# Patient Record
Sex: Female | Born: 1995
Health system: Southern US, Community
[De-identification: ages and names within clinical notes are randomized; demographics above are authoritative.]

## PROBLEM LIST (undated history)

## (undated) DIAGNOSIS — K589 Irritable bowel syndrome without diarrhea: Secondary | ICD-10-CM

## (undated) HISTORY — DX: Irritable bowel syndrome, unspecified: K58.9

## (undated) HISTORY — PX: COLONOSCOPY: SHX174

---
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
## 2015-04-05 ENCOUNTER — Ambulatory Visit (INDEPENDENT_AMBULATORY_CARE_PROVIDER_SITE_OTHER): Payer: BLUE CROSS/BLUE SHIELD | Admitting: Family Medicine

## 2015-04-05 ENCOUNTER — Encounter: Payer: Self-pay | Admitting: Family Medicine

## 2015-04-05 VITALS — BP 110/70 | HR 68 | Temp 98.0°F | Wt 137.8 lb

## 2015-04-05 DIAGNOSIS — K589 Irritable bowel syndrome without diarrhea: Secondary | ICD-10-CM

## 2015-04-05 DIAGNOSIS — J029 Acute pharyngitis, unspecified: Secondary | ICD-10-CM | POA: Diagnosis not present

## 2015-04-05 DIAGNOSIS — Z7689 Persons encountering health services in other specified circumstances: Secondary | ICD-10-CM

## 2015-04-05 DIAGNOSIS — Z23 Encounter for immunization: Secondary | ICD-10-CM | POA: Diagnosis not present

## 2015-04-05 DIAGNOSIS — Z7189 Other specified counseling: Secondary | ICD-10-CM

## 2015-04-05 DIAGNOSIS — R51 Headache: Secondary | ICD-10-CM

## 2015-04-05 DIAGNOSIS — R519 Headache, unspecified: Secondary | ICD-10-CM | POA: Insufficient documentation

## 2015-04-05 LAB — POCT RAPID STREP A (OFFICE): Rapid Strep A Screen: NEGATIVE

## 2015-04-05 NOTE — Patient Instructions (Signed)
You received your flu shot today.   Treat your symptoms by staying well hydrated, taking Tylenol or ibuprofen as needed for low-grade fever or body aches and gargling with warm salt water.  Schedule an appointment at your convenience to return for complete physical exam and fasting labs.  Pharyngitis Pharyngitis is redness, pain, and swelling (inflammation) of your pharynx.  CAUSES  Pharyngitis is usually caused by infection. Most of the time, these infections are from viruses (viral) and are part of a cold. However, sometimes pharyngitis is caused by bacteria (bacterial). Pharyngitis can also be caused by allergies. Viral pharyngitis may be spread from person to person by coughing, sneezing, and personal items or utensils (cups, forks, spoons, toothbrushes). Bacterial pharyngitis may be spread from person to person by more intimate contact, such as kissing.  SIGNS AND SYMPTOMS  Symptoms of pharyngitis include:   Sore throat.   Tiredness (fatigue).   Low-grade fever.   Headache.  Joint pain and muscle aches.  Skin rashes.  Swollen lymph nodes.  Plaque-like film on throat or tonsils (often seen with bacterial pharyngitis). DIAGNOSIS  Your health care provider will ask you questions about your illness and your symptoms. Your medical history, along with a physical exam, is often all that is needed to diagnose pharyngitis. Sometimes, a rapid strep test is done. Other lab tests may also be done, depending on the suspected cause.  TREATMENT  Viral pharyngitis will usually get better in 3-4 days without the use of medicine. Bacterial pharyngitis is treated with medicines that kill germs (antibiotics).  HOME CARE INSTRUCTIONS   Drink enough water and fluids to keep your urine clear or pale yellow.   Only take over-the-counter or prescription medicines as directed by your health care provider:   If you are prescribed antibiotics, make sure you finish them even if you start to feel  better.   Do not take aspirin.   Get lots of rest.   Gargle with 8 oz of salt water ( tsp of salt per 1 qt of water) as often as every 1-2 hours to soothe your throat.   Throat lozenges (if you are not at risk for choking) or sprays may be used to soothe your throat. SEEK MEDICAL CARE IF:   You have large, tender lumps in your neck.  You have a rash.  You cough up green, yellow-brown, or bloody spit. SEEK IMMEDIATE MEDICAL CARE IF:   Your neck becomes stiff.  You drool or are unable to swallow liquids.  You vomit or are unable to keep medicines or liquids down.  You have severe pain that does not go away with the use of recommended medicines.  You have trouble breathing (not caused by a stuffy nose). MAKE SURE YOU:   Understand these instructions.  Will watch your condition.  Will get help right away if you are not doing well or get worse.   This information is not intended to replace advice given to you by your health care provider. Make sure you discuss any questions you have with your health care provider.   Document Released: 05/15/2005 Document Revised: 03/05/2013 Document Reviewed: 01/20/2013 Elsevier Interactive Patient Education [employer].

## 2015-04-05 NOTE — Progress Notes (Signed)
Subjective:    Patient ID: Rebecca Schmidt, female    DOB: [birth year], 19 y.o.   MRN: [id number]  HPI Chief Complaint  Patient presents with  . new pt    sore throat since sunday.    She is new to the practice and here to establish primary care. She is also here for an acute complaint. Complains of sore throat for past 2 days and left ear pain and dry cough that started this morning. States her boyfriend has been sick also.  Denies fever, chills, drainage, nausea, vomiting, diarrhea. Has tried dayquil and nyquil. Does not smoke.  Also complains of frequent mild bilateral frontal headaches that have been ongoing, unsure if a pattern exists or what they are related to but states she does not typically stay hydrated.   She is a Museum/gallery exhibitions officer at [employer]. Has boyfriend and using Nuvaring. No issues with this. Has older brother.  States she had a pap smear this year and it was normal.   Will check immunizations and would like HPV vaccine if never had it.  Flu shot- would like to get this today.   Reviewed allergies, medications, past medical, surgical, family , and social history.  Review of Systems Pertinent positives and negatives in the history of present illness.    Objective:   Physical Exam  Constitutional: She is oriented to person, place, and time. She appears well-developed and well-nourished. No distress.  HENT:  Right Ear: Tympanic membrane and ear canal normal.  Left Ear: Tympanic membrane and ear canal normal.  Nose: Nose normal.  Mouth/Throat: Mucous membranes are normal. Posterior oropharyngeal edema and posterior oropharyngeal erythema present. No oropharyngeal exudate.  Eyes: Conjunctivae are normal.  Neck: Normal range of motion and full passive range of motion without pain. Neck supple.  Cardiovascular: Normal rate, regular rhythm and normal heart sounds.   Pulmonary/Chest: Effort normal and breath sounds normal. She has no wheezes. She has no rhonchi.  Lymphadenopathy:         Head (right side): No submandibular, no tonsillar and no occipital adenopathy present.       Head (left side): No submandibular, no tonsillar and no occipital adenopathy present.    She has no cervical adenopathy.       Right: No supraclavicular adenopathy present.       Left: No supraclavicular adenopathy present.  Tenderness noted to anterior cervical nodes, right greater than left.   Neurological: She is alert and oriented to person, place, and time.  Skin: Skin is warm and dry. No cyanosis. No pallor. Nails show no clubbing.   Rapid strep negative    Assessment & Plan:  Acute pharyngitis, unspecified etiology - Plan: POCT rapid strep A  Encounter to establish care  IBS (irritable bowel syndrome)  Need for influenza vaccination - Plan: Flu Vaccine QUAD 36+ mos IM  Frequent headaches  Discussed that her strep test was negative and that her symptoms are most likely related to a viral etiology.  Recommend symptomatic treatment including salt water gargles , staying well-hydrated, using Tylenol or ibuprofen as needed,  She may also use throat lozenges.  Received flu shot today. Her IBS seems to be well managed at present. She takes her Bentyl as needed. No changes recommended. Headaches appear to be related to stress/tension and possibly not staying well hydrated. She will try hydrating and keeping record of when she has the headaches and we will further discuss this at next appointment.  She will make an appointment  to return for fasting labs and complete physical exam.

## 2015-06-21 ENCOUNTER — Encounter: Payer: Self-pay | Admitting: Family Medicine

## 2015-06-21 ENCOUNTER — Ambulatory Visit (INDEPENDENT_AMBULATORY_CARE_PROVIDER_SITE_OTHER): Payer: BLUE CROSS/BLUE SHIELD | Admitting: Family Medicine

## 2015-06-21 VITALS — BP 116/68 | HR 64 | Temp 98.1°F | Wt 141.2 lb

## 2015-06-21 DIAGNOSIS — J069 Acute upper respiratory infection, unspecified: Secondary | ICD-10-CM | POA: Diagnosis not present

## 2015-06-21 NOTE — Patient Instructions (Signed)
Try taking delsym, mucinex cough or robitussin cough. Drink extra fluids. Let me know if you get worse.

## 2015-06-21 NOTE — Progress Notes (Signed)
Subjective:  Rebecca Schmidt is a 20 y.o. female who presents for complaints of URI symptoms. 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 She states she gets better for a few weeks and then gets another cold.   Symptoms this time include a 5 day history of runny nose, congestion, dry cough, sore throat . States she is 50% better. Denies fever, chills, body aches, ear pain, nausea, vomiting or diarrhea.   Treatment to date: none. sick contacts boyfriend.  No other aggravating or relieving factors.  No other c/o.  ROS as in subjective.    Objective: Filed Vitals:   06/21/15 1619  BP: 116/68  Pulse: 64  Temp: 98.1 F (36.7 C)    General appearance: Alert, WD/WN, no distress, mildly ill appearing                             Skin: warm, no rash                           Head: no sinus tenderness                            Eyes: conjunctiva normal, corneas clear, PERRLA                            Ears: pearly TMs, external ear canals normal                          Nose: septum midline, turbinates swollen, with erythema and clear discharge             Mouth/throat: MMM, tongue normal, mild pharyngeal erythema                           Neck: supple, no adenopathy, no thyromegaly, nontender                          Heart: RRR, normal S1, S2, no murmurs                         Lungs: CTA bilaterally, no wheezes, rales, or rhonchi     Assessment: Acute upper respiratory infection  Plan: Discussed diagnosis and treatment of URI.  Suggested symptomatic OTC remedies such as delsym, and staying well hydrated.  Nasal saline spray for congestion.  Tylenol or Ibuprofen OTC for fever and malaise.  Call/return in 2-3 days if symptoms aren't resolving.

## 2015-06-28 ENCOUNTER — Encounter: Payer: Self-pay | Admitting: Family Medicine

## 2015-06-28 ENCOUNTER — Ambulatory Visit (INDEPENDENT_AMBULATORY_CARE_PROVIDER_SITE_OTHER): Payer: BLUE CROSS/BLUE SHIELD | Admitting: Family Medicine

## 2015-06-28 VITALS — BP 112/64 | HR 64 | Ht 68.5 in | Wt 139.6 lb

## 2015-06-28 DIAGNOSIS — Z808 Family history of malignant neoplasm of other organs or systems: Secondary | ICD-10-CM | POA: Diagnosis not present

## 2015-06-28 DIAGNOSIS — Z0000 Encounter for general adult medical examination without abnormal findings: Secondary | ICD-10-CM | POA: Diagnosis not present

## 2015-06-28 DIAGNOSIS — D239 Other benign neoplasm of skin, unspecified: Secondary | ICD-10-CM | POA: Diagnosis not present

## 2015-06-28 DIAGNOSIS — Z23 Encounter for immunization: Secondary | ICD-10-CM | POA: Diagnosis not present

## 2015-06-28 DIAGNOSIS — R5383 Other fatigue: Secondary | ICD-10-CM

## 2015-06-28 DIAGNOSIS — D229 Melanocytic nevi, unspecified: Secondary | ICD-10-CM

## 2015-06-28 DIAGNOSIS — Z8342 Family history of familial hypercholesterolemia: Secondary | ICD-10-CM | POA: Insufficient documentation

## 2015-06-28 DIAGNOSIS — K589 Irritable bowel syndrome without diarrhea: Secondary | ICD-10-CM

## 2015-06-28 LAB — LIPID PANEL
CHOLESTEROL: 141 mg/dL (ref 125–170)
HDL: 36 mg/dL — ABNORMAL LOW (ref >=46)
LDL CALC: 90 mg/dL (ref <110)
TRIGLYCERIDES: 73 mg/dL (ref <150)
Total CHOL/HDL Ratio: 3.9 Ratio (ref <=5.0)
VLDL: 15 mg/dL (ref <30)

## 2015-06-28 LAB — POCT URINALYSIS DIPSTICK
BILIRUBIN UA: NEGATIVE
Blood, UA: NEGATIVE
GLUCOSE UA: NEGATIVE
Ketones, UA: NEGATIVE
LEUKOCYTES UA: NEGATIVE
NITRITE UA: NEGATIVE
PH UA: 6
Protein, UA: NEGATIVE
Spec Grav, UA: >=1.03
UROBILINOGEN UA: NEGATIVE

## 2015-06-28 LAB — COMPREHENSIVE METABOLIC PANEL
ALBUMIN: 4.3 g/dL (ref 3.6–5.1)
ALK PHOS: 48 U/L (ref 33–115)
ALT: 14 U/L (ref 6–29)
AST: 18 U/L (ref 10–30)
BILIRUBIN TOTAL: 0.7 mg/dL (ref 0.2–1.2)
BUN: 7 mg/dL (ref 7–25)
CALCIUM: 9.6 mg/dL (ref 8.6–10.2)
CO2: 24 mmol/L (ref 20–31)
CREATININE: 0.6 mg/dL (ref 0.50–1.10)
Chloride: 104 mmol/L (ref 98–110)
Glucose, Bld: 72 mg/dL (ref 65–99)
Potassium: 4.2 mmol/L (ref 3.5–5.3)
Sodium: 137 mmol/L (ref 135–146)
Total Protein: 7.3 g/dL (ref 6.1–8.1)

## 2015-06-28 LAB — CBC WITH DIFFERENTIAL/PLATELET
BASOS ABS: 0 10*3/uL (ref 0.0–0.1)
BASOS PCT: 0 % (ref 0–1)
EOS ABS: 0.1 10*3/uL (ref 0.0–0.7)
Eosinophils Relative: 1 % (ref 0–5)
HEMATOCRIT: 40.6 % (ref 36.0–46.0)
HEMOGLOBIN: 13.5 g/dL (ref 12.0–15.0)
Lymphocytes Relative: 31 % (ref 12–46)
Lymphs Abs: 3 10*3/uL (ref 0.7–4.0)
MCH: 28.9 pg (ref 26.0–34.0)
MCHC: 33.3 g/dL (ref 30.0–36.0)
MCV: 86.9 fL (ref 78.0–100.0)
MONOS PCT: 8 % (ref 3–12)
MPV: 10.7 fL (ref 8.6–12.4)
Monocytes Absolute: 0.8 10*3/uL (ref 0.1–1.0)
NEUTROS ABS: 5.8 10*3/uL (ref 1.7–7.7)
NEUTROS PCT: 60 % (ref 43–77)
Platelets: 275 10*3/uL (ref 150–400)
RBC: 4.67 MIL/uL (ref 3.87–5.11)
RDW: 13.4 % (ref 11.5–15.5)
WBC: 9.6 10*3/uL (ref 4.0–10.5)

## 2015-06-28 LAB — TSH: TSH: 0.99 u[IU]/mL (ref 0.350–4.500)

## 2015-06-28 NOTE — Patient Instructions (Addendum)
Return in 1-2 months for 2nd Gardasil vaccine and then in 6 months for 3rd and final injection.  Human Papillomavirus Quadrivalent Vaccine suspension for injection What is this medicine? HUMAN PAPILLOMAVIRUS VACCINE (HYOO muhn pap uh LOH muh vahy ruhs vak SEEN) is a vaccine. It is used to prevent infections of four types of the human papillomavirus. In women, the vaccine may lower your risk of getting cervical, vaginal, vulvar, or anal cancer and genital warts. In men, the vaccine may lower your risk of getting genital warts and anal cancer. You cannot get these diseases from the vaccine. This vaccine does not treat these diseases. This medicine may be used for other purposes; ask your health care provider or pharmacist if you have questions. What should I tell my health care provider before I take this medicine? They need to know if you have any of these conditions: -fever or infection -hemophilia -HIV infection or AIDS -immune system problems -low platelet count -an unusual reaction to Human Papillomavirus Vaccine, yeast, other medicines, foods, dyes, or preservatives -pregnant or trying to get pregnant -breast-feeding How should I use this medicine? This vaccine is for injection in a muscle on your upper arm or thigh. It is given by a health care professional. Dennis Bast will be observed for 15 minutes after each dose. Sometimes, fainting happens after the vaccine is given. You may be asked to sit or lie down during the 15 minutes. Three doses are given. The second dose is given 2 months after the first dose. The last dose is given 4 months after the second dose. A copy of a Vaccine Information Statement will be given before each vaccination. Read this sheet carefully each time. The sheet may change frequently. Talk to your pediatrician regarding the use of this medicine in children. While this drug may be prescribed for children as young as 33 years of age for selected conditions, precautions do  apply. Overdosage: If you think you have taken too much of this medicine contact a poison control center or emergency room at once. NOTE: This medicine is only for you. Do not share this medicine with others. What if I miss a dose? All 3 doses of the vaccine should be given within 6 months. Remember to keep appointments for follow-up doses. Your health care provider will tell you when to return for the next vaccine. Ask your health care professional for advice if you are unable to keep an appointment or miss a scheduled dose. What may interact with this medicine? -other vaccines This list may not describe all possible interactions. Give your health care provider a list of all the medicines, herbs, non-prescription drugs, or dietary supplements you use. Also tell them if you smoke, drink alcohol, or use illegal drugs. Some items may interact with your medicine. What should I watch for while using this medicine? This vaccine may not fully protect everyone. Continue to have regular pelvic exams and cervical or anal cancer screenings as directed by your doctor. The Human Papillomavirus is a sexually transmitted disease. It can be passed by any kind of sexual activity that involves genital contact. The vaccine works best when given before you have any contact with the virus. Many people who have the virus do not have any signs or symptoms. Tell your doctor or health care professional if you have any reaction or unusual symptom after getting the vaccine. What side effects may I notice from receiving this medicine? Side effects that you should report to your doctor or health  care professional as soon as possible: -allergic reactions like skin rash, itching or hives, swelling of the face, lips, or tongue -breathing problems -feeling faint or lightheaded, falls Side effects that usually do not require medical attention (report to your doctor or health care professional if they continue or are  bothersome): -cough -dizziness -fever -headache -nausea -redness, warmth, swelling, pain, or itching at site where injected This list may not describe all possible side effects. Call your doctor for medical advice about side effects. You may report side effects to FDA at 1-800-FDA-1088. Where should I keep my medicine? This drug is given in a hospital or clinic and will not be stored at home. NOTE: This sheet is a summary. It may not cover all possible information. If you have questions about this medicine, talk to your doctor, pharmacist, or health care provider.    2016, Elsevier/Gold Standard. (2013-07-07 13:14:33)  [facility] [place] [place], [place] [facility] for Adults - Female      MAINTAIN REGULAR HEALTH EXAMS:  A routine yearly physical is a good way to check in with your primary care provider about your health and preventive screening. It is also an opportunity to share updates about your health and any concerns you have, and receive a thorough all-over exam.   Most health insurance companies pay for at least some preventative services.  Check with your health plan for specific coverages.  WHAT PREVENTATIVE SERVICES DO WOMEN NEED?  Adult women should have their weight and blood pressure checked regularly.   Women age 57 and older should have their cholesterol levels checked regularly.  Women should be screened for cervical cancer with a Pap smear and pelvic exam beginning at either age 45, or 3 years after they become sexually activity.    Breast cancer screening generally begins at age 66 with a mammogram and breast exam by your primary care provider.    Beginning at age 24 and continuing to age 29, women should be screened for colorectal cancer.  Certain people may need continued testing until age 39.  Updating vaccinations is part of preventative care.  Vaccinations help protect against diseases such as the  flu.  Osteoporosis is a disease in which the bones lose minerals and strength as we age. Women ages 50 and over should discuss this with their caregivers, as should women after menopause who have other risk factors.  Lab tests are generally done as part of preventative care to screen for anemia and blood disorders, to screen for problems with the kidneys and liver, to screen for bladder problems, to check blood sugar, and to check your cholesterol level.  Preventative services generally include counseling about diet, exercise, avoiding tobacco, drugs, excessive alcohol consumption, and sexually transmitted infections.    GENERAL RECOMMENDATIONS FOR GOOD HEALTH:  Healthy diet:  Eat a variety of foods, including fruit, vegetables, animal or vegetable protein, such as meat, fish, chicken, and eggs, or beans, lentils, tofu, and grains, such as rice.  Drink plenty of water daily.  Decrease saturated fat in the diet, avoid lots of red meat, processed foods, sweets, fast foods, and fried foods.  Exercise:  Aerobic exercise helps maintain good heart health. At least 30-40 minutes of moderate-intensity exercise is recommended. For example, a brisk walk that increases your heart rate and breathing. This should be done on most days of the week.   Find a type of exercise or a variety of exercises that you enjoy so that  it becomes a part of your daily life.  Examples are running, walking, swimming, water aerobics, and biking.  For motivation and support, explore group exercise such as aerobic class, spin class, Zumba, Yoga,or  martial arts, etc.    Set exercise goals for yourself, such as a certain weight goal, walk or run in a race such as a 5k walk/run.  Speak to your primary care provider about exercise goals.  Disease prevention:  If you smoke or chew tobacco, find out from your caregiver how to quit. It can literally save your life, no matter how long you have been a tobacco user. If you do not  use tobacco, never begin.   Maintain a healthy diet and normal weight. Increased weight leads to problems with blood pressure and diabetes.   The Body Mass Index or BMI is a way of measuring how much of your body is fat. Having a BMI above 27 increases the risk of heart disease, diabetes, hypertension, stroke and other problems related to obesity. Your caregiver can help determine your BMI and based on it develop an exercise and dietary program to help you achieve or maintain this important measurement at a healthful level.  High blood pressure causes heart and blood vessel problems.  Persistent high blood pressure should be treated with medicine if weight loss and exercise do not work.   Fat and cholesterol leaves deposits in your arteries that can block them. This causes heart disease and vessel disease elsewhere in your body.  If your cholesterol is found to be high, or if you have heart disease or certain other medical conditions, then you may need to have your cholesterol monitored frequently and be treated with medication.   Ask if you should have a cardiac stress test if your history suggests this. A stress test is a test done on a treadmill that looks for heart disease. This test can find disease prior to there being a problem.  Menopause can be associated with physical symptoms and risks. Hormone replacement therapy is available to decrease these. You should talk to your caregiver about whether starting or continuing to take hormones is right for you.   Osteoporosis is a disease in which the bones lose minerals and strength as we age. This can result in serious bone fractures. Risk of osteoporosis can be identified using a bone density scan. Women ages 65 and over should discuss this with their caregivers, as should women after menopause who have other risk factors. Ask your caregiver whether you should be taking a calcium supplement and Vitamin D, to reduce the rate of osteoporosis.   Avoid  drinking alcohol in excess (more than two drinks per day).  Avoid use of street drugs. Do not share needles with anyone. Ask for professional help if you need assistance or instructions on stopping the use of alcohol, cigarettes, and/or drugs.  Brush your teeth twice a day with fluoride toothpaste, and floss once a day. Good oral hygiene prevents tooth decay and gum disease. The problems can be painful, unattractive, and can cause other health problems. Visit your dentist for a routine oral and dental check up and preventive care every 6-12 months.   Look at your skin regularly.  Use a mirror to look at your back. Notify your caregivers of changes in moles, especially if there are changes in shapes, colors, a size larger than a pencil eraser, an irregular border, or development of new moles.  Safety:  Use seatbelts 100% of the time,  whether driving or as a passenger.  Use safety devices such as hearing protection if you work in environments with loud noise or significant background noise.  Use safety glasses when doing any work that could send debris in to the eyes.  Use a helmet if you ride a bike or motorcycle.  Use appropriate safety gear for contact sports.  Talk to your caregiver about gun safety.  Use sunscreen with a SPF (or skin protection factor) of 15 or greater.  Lighter skinned people are at a greater risk of skin cancer. Don't forget to also wear sunglasses in order to protect your eyes from too much damaging sunlight. Damaging sunlight can accelerate cataract formation.   Practice safe sex. Use condoms. Condoms are used for birth control and to help reduce the spread of sexually transmitted infections (or STIs).  Some of the STIs are gonorrhea (the clap), chlamydia, syphilis, trichomonas, herpes, HPV (human papilloma virus) and HIV (human immunodeficiency virus) which causes AIDS. The herpes, HIV and HPV are viral illnesses that have no cure. These can result in disability, cancer and  death.   Keep carbon monoxide and smoke detectors in your home functioning at all times. Change the batteries every 6 months or use a model that plugs into the wall.   Vaccinations:  Stay up to date with your tetanus shots and other required immunizations. You should have a booster for tetanus every 10 years. Be sure to get your flu shot every year, since 5%-20% of the [place] population comes down with the flu. The flu vaccine changes each year, so being vaccinated once is not enough. Get your shot in the fall, before the flu season peaks.   Other vaccines to consider:  Human Papilloma Virus or HPV causes cancer of the cervix, and other infections that can be transmitted from person to person. There is a vaccine for HPV, and females should get immunized between the ages of 39 and 75. It requires a series of 3 shots.   Pneumococcal vaccine to protect against certain types of pneumonia.  This is normally recommended for adults age 31 or older.  However, adults younger than 21 years old with certain underlying conditions such as diabetes, heart or lung disease should also receive the vaccine.  Shingles vaccine to protect against Varicella Zoster if you are older than age 51, or younger than 21 years old with certain underlying illness.  Hepatitis A vaccine to protect against a form of infection of the liver by a virus acquired from food.  Hepatitis B vaccine to protect against a form of infection of the liver by a virus acquired from blood or body fluids, particularly if you work in health care.  If you plan to travel internationally, check with your local health department for specific vaccination recommendations.  Cancer Screening:  Breast cancer screening is essential to preventive care for women. All women age 110 and older should perform a breast self-exam every month. At age 54 and older, women should have their caregiver complete a breast exam each year. Women at ages 43 and older should have  a mammogram (x-ray film) of the breasts. Your caregiver can discuss how often you need mammograms.    Cervical cancer screening includes taking a Pap smear (sample of cells examined under a microscope) from the cervix (end of the uterus). It also includes testing for HPV (Human Papilloma Virus, which can cause cervical cancer). Screening and a pelvic exam should begin at age 63, or 3 years  after a woman becomes sexually active. Screening should occur every year, with a Pap smear but no HPV testing, up to age 38. After age 69, you should have a Pap smear every 3 years with HPV testing, if no HPV was found previously.   Most routine colon cancer screening begins at the age of 74. On a yearly basis, doctors may provide special easy to use take-home tests to check for hidden blood in the stool. Sigmoidoscopy or colonoscopy can detect the earliest forms of colon cancer and is life saving. These tests use a small camera at the end of a tube to directly examine the colon. Speak to your caregiver about this at age 1, when routine screening begins (and is repeated every 5 years unless early forms of pre-cancerous polyps or small growths are found).

## 2015-06-28 NOTE — Progress Notes (Signed)
Subjective:    Patient ID: Rebecca Schmidt, female    DOB: [birth year], 20 y.o.   MRN: [id number]  HPI Chief Complaint  Patient presents with  . fasting cpe    fasting cpe.    She is here for complete physical exam. Complaints of mild fatigue. 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 She also states her roommate keeps her up late.   Also reports 2 moles to her back, requesting dermatologist referral. States her mother had malignant melanoma and had to have surgery. She states she avoids sun and wears sunscreen now but as child she recalls having sunburns.  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 She reports taking Bentyl as needed. She states she is managing symptoms fine for now.   Other providers: Gynecologist is at [facility], Dr. Toy Cookey in [facility] diagnosed with IBS 1-2 years ago.  Last physical exam and blood work in 2015 per patient.   Pap smear- normal last year, in past 6 months at [facility].  Would like HPV vaccine, has not had this.   LMP: 06/02/2015 Contraception: uses Nuvaring In a monogamous relationship with boyfriend  Denies smoking, alcohol use 1-2 times per month, denies drug use. Tried marijuana once but did not like it.  She tries to exercise 2-3 times per week. Lifts weights and cardiovascular.  Patient states she is a hypochondriac. States when she complains her family just ignores her now.    Freshman at [employer]. She just moved into her dorm and does not care for her roommate, she states she is inconsiderate.  Health maintenance: dentist- is due for follow up for cavity.   Past Medical History  Diagnosis Date  . IBS (irritable bowel syndrome)      Review of Systems Review of Systems Constitutional: -fever, -chills, -sweats, -unexpected weight change,+ mild fatigue ENT: -runny nose, -ear pain, -sore throat Cardiology:  -chest pain,  -palpitations, -edema Respiratory: -cough, -shortness of breath, -wheezing Gastroenterology: -abdominal pain, -nausea, -vomiting, +diarrhea, +constipation  Hematology: -bleeding or bruising problems Musculoskeletal: +arthralgias- bilateral knee pain since childhood, -myalgias, -joint swelling, -back pain Ophthalmology: -vision changes Urology: -dysuria, -difficulty urinating, -hematuria, -urinary frequency, -urgency Neurology: -headache, -weakness, -tingling, -numbness       Objective:   Physical Exam BP 112/64 mmHg  Pulse 64  Ht 5' 8.5" (1.74 m)  Wt 139 lb 9.6 oz (63.322 kg)  BMI 20.91 kg/m2  LMP 06/02/2015  General Appearance:    Alert, cooperative, no distress, appears stated age  Head:    Normocephalic, without obvious abnormality, atraumatic  Eyes:    PERRL, conjunctiva/corneas clear, EOM's intact, fundi    benign  Ears:    Normal TM's and external ear canals  Nose:   Nares normal, mucosa normal, no drainage or sinus   tenderness  Throat:   Lips, mucosa, and tongue normal; teeth and gums normal  Neck:   Supple, no lymphadenopathy;  thyroid:  no   enlargement/tenderness/nodules; no carotid   bruit or JVD  Back:    Spine nontender, no curvature, ROM normal, no CVA     tenderness  Lungs:     Clear to auscultation bilaterally without wheezes, rales or     ronchi; respirations unlabored  Chest Wall:    No tenderness or deformity   Heart:    Regular rate and rhythm, S1 and S2 normal, no murmur, rub   or gallop  Breast Exam:    Refused, had this at Gynecologist 6 months ago  Abdomen:     Soft, non-tender, nondistended, normoactive bowel sounds,    no masses, no hepatosplenomegaly  Genitalia:    Not done- had this at [facility] 6 months ago  Rectal:    Not performed due to age<40 and no related complaints  Extremities:   No clubbing, cyanosis or edema  Pulses:   2+ and symmetric all extremities  Skin:   Skin color, texture, turgor normal, no rashes, raised 1 cm asymmetrical  and non uniform color nevi to left upper back  Lymph nodes:   Cervical, supraclavicular, and axillary nodes normal  Neurologic:   CNII-XII intact, normal strength, sensation and gait; reflexes 2+ and symmetric throughout          Psych:   Normal mood, affect, hygiene and grooming.     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 Suspect that her fatigue is related to stress of college and lack of sleep but will check for underlying physiologic etiologies.  She cannot recall having her cholesterol checked in past. Her father has history of elevated cholesterol. She is fasting today in order to check lipids. Referral made to atypical mole on back. Discussed that she is fair skinned and recommend that she continue wearing sunscreen and avoid sun.    1st dose of Gardasil given today. She will return in 1-2 months for 2nd injection and in 6 months for 3rd and final.  She is up to date on flu and other immunizations.  Discussed safety and health promotion. She will schedule with dentist for filling as discussed.  Follow up pending lab results.

## 2015-08-11 ENCOUNTER — Other Ambulatory Visit (INDEPENDENT_AMBULATORY_CARE_PROVIDER_SITE_OTHER): Payer: BLUE CROSS/BLUE SHIELD

## 2015-08-11 DIAGNOSIS — Z23 Encounter for immunization: Secondary | ICD-10-CM | POA: Diagnosis not present

## 2015-12-24 ENCOUNTER — Encounter: Payer: Self-pay | Admitting: Family Medicine

## 2016-01-12 ENCOUNTER — Ambulatory Visit (INDEPENDENT_AMBULATORY_CARE_PROVIDER_SITE_OTHER): Payer: BLUE CROSS/BLUE SHIELD | Admitting: Family Medicine

## 2016-01-12 ENCOUNTER — Encounter: Payer: Self-pay | Admitting: Family Medicine

## 2016-01-12 VITALS — BP 116/70 | HR 77 | Temp 98.6°F | Wt 141.0 lb

## 2016-01-12 DIAGNOSIS — R110 Nausea: Secondary | ICD-10-CM

## 2016-01-12 DIAGNOSIS — Z8719 Personal history of other diseases of the digestive system: Secondary | ICD-10-CM

## 2016-01-12 LAB — CBC WITH DIFFERENTIAL/PLATELET
BASOS ABS: 0 {cells}/uL (ref 0–200)
Basophils Relative: 0 %
EOS PCT: 1 %
Eosinophils Absolute: 59 cells/uL (ref 15–500)
HEMATOCRIT: 40.4 % (ref 35.0–45.0)
HEMOGLOBIN: 13.7 g/dL (ref 11.7–15.5)
LYMPHS PCT: 35 %
Lymphs Abs: 2065 cells/uL (ref 850–3900)
MCH: 29.7 pg (ref 27.0–33.0)
MCHC: 33.9 g/dL (ref 32.0–36.0)
MCV: 87.6 fL (ref 80.0–100.0)
MONO ABS: 531 {cells}/uL (ref 200–950)
MPV: 11 fL (ref 7.5–12.5)
Monocytes Relative: 9 %
NEUTROS PCT: 55 %
Neutro Abs: 3245 cells/uL (ref 1500–7800)
Platelets: 239 10*3/uL (ref 140–400)
RBC: 4.61 MIL/uL (ref 3.80–5.10)
RDW: 13.1 % (ref 11.0–15.0)
WBC: 5.9 10*3/uL (ref 4.0–10.5)

## 2016-01-12 LAB — POCT URINE PREGNANCY: Preg Test, Ur: NEGATIVE

## 2016-01-12 NOTE — Progress Notes (Signed)
   Subjective:    Patient ID: Rebecca Schmidt, female    DOB: 1996-01-08, 20 y.o.   MRN: [id number]  HPI Chief Complaint  Patient presents with  . Other    nauseous- off and on for a week   She is here with complaints of intermittent nausea for about a week. States the nausea occurs when she is hungry and sometimes when she is riding in a car. States sometimes nausea occurs with moving around a lot of getting hot. Denies dizziness, weakness or fatigue.  History of IBS, has had some watery diarrhea. Started back to school  Denies fever, chills, ear pain, nasal congestion, drainage, sore throat, cough, vomiting, or abdominal pain. Reports history of IBS that has not bothered her much in recent past. She has had some loose/watery stools intermittently.  Denies acid reflux.  She has noted increased stress, school just started back. She is at [employer]. Has a roommate.   States she skips breakfast often and eats a lot of carbohydrates. Sometimes feels shaky. Family history of diabetes.    Birth control is Nuvaring. No sexual activity in 2-3 months. Periods are regular.   Reviewed allergies, medications, past medical, family, and social history.    Review of Systems  Pertinent positives and negatives in the history of present illness.     Objective:   Physical Exam BP 116/70   Pulse 77   Temp 98.6 F (37 C) (Oral)   Wt 141 lb (64 kg)   BMI 21.13 kg/m  Alert and in no distress. Tympanic membranes and canals are normal. Pharyngeal area is normal. Neck is supple without adenopathy or thyromegaly. Cardiac exam shows a regular sinus rhythm without murmurs or gallops. Lungs are clear to auscultation. Abdomen soft, nontender, no rebound, guarding, normal bowel sounds, no hepatosplenomegaly.    Urine pregnancy negative    Assessment & Plan:  Nausea without vomiting - Plan: CBC with Differential/Platelet, Comprehensive metabolic panel, POCT urine pregnancy  History of IBS  Discussed  multiple etiologies for nausea. Plan to rule out pregnancy. She does not want to take medication therefore I recommend that she try ginger. I also recommend eating small frequent meals that include protein and avoid spicy, fried foods and foods that upset her stomach for at least the next 48 hours.  Discussed that even though she does not have symptoms of acid reflux that our next step would be trying antacid. She will call if she decides she would like to try Zofran but for now plan to try to determine underlying etiology for nausea. Will f/u pending labs.

## 2016-01-12 NOTE — Patient Instructions (Signed)
Stay well hydrated and try to eat small frequent meals that include protein and not all carbs.  Try taking ginger in some form, gum, ginger ale, etc.  If you are not improving then let me know in the next few days or if your symptoms get worse.  We will call with labs.    Nausea, Adult Nausea is the feeling that you have an upset stomach or have to vomit. Nausea by itself is not likely a serious concern, but it may be an early sign of more serious medical problems. As nausea gets worse, it can lead to vomiting. If vomiting develops, there is the risk of dehydration.  CAUSES   Viral infections.  Food poisoning.  Medicines.  Pregnancy.  Motion sickness.  Migraine headaches.  Emotional distress.  Severe pain from any source.  Alcohol intoxication. HOME CARE INSTRUCTIONS  Get plenty of rest.  Ask your caregiver about specific rehydration instructions.  Eat small amounts of food and sip liquids more often.  Take all medicines as told by your caregiver. SEEK MEDICAL CARE IF:  You have not improved after 2 days, or you get worse.  You have a headache. SEEK IMMEDIATE MEDICAL CARE IF:   You have a fever.  You faint.  You keep vomiting or have blood in your vomit.  You are extremely weak or dehydrated.  You have dark or bloody stools.  You have severe chest or abdominal pain. MAKE SURE YOU:  Understand these instructions.  Will watch your condition.  Will get help right away if you are not doing well or get worse.   This information is not intended to replace advice given to you by your health care provider. Make sure you discuss any questions you have with your health care provider.   Document Released: 06/22/2004 Document Revised: 06/05/2014 Document Reviewed: 01/25/2011 Elsevier Interactive Patient Education [employer].

## 2016-01-13 LAB — COMPREHENSIVE METABOLIC PANEL
ALBUMIN: 4.5 g/dL (ref 3.6–5.1)
ALT: 15 U/L (ref 6–29)
AST: 20 U/L (ref 10–30)
Alkaline Phosphatase: 55 U/L (ref 33–115)
BILIRUBIN TOTAL: 0.6 mg/dL (ref 0.2–1.2)
BUN: 12 mg/dL (ref 7–25)
CALCIUM: 9.7 mg/dL (ref 8.6–10.2)
CHLORIDE: 105 mmol/L (ref 98–110)
CO2: 22 mmol/L (ref 20–31)
Creat: 0.75 mg/dL (ref 0.50–1.10)
GLUCOSE: 74 mg/dL (ref 65–99)
Potassium: 4.1 mmol/L (ref 3.5–5.3)
Sodium: 137 mmol/L (ref 135–146)
Total Protein: 7.4 g/dL (ref 6.1–8.1)

## 2016-02-16 ENCOUNTER — Ambulatory Visit (INDEPENDENT_AMBULATORY_CARE_PROVIDER_SITE_OTHER): Payer: BLUE CROSS/BLUE SHIELD | Admitting: Family Medicine

## 2016-02-16 ENCOUNTER — Encounter: Payer: Self-pay | Admitting: Family Medicine

## 2016-02-16 VITALS — BP 118/70 | HR 80 | Temp 99.0°F | Ht 68.5 in | Wt 138.4 lb

## 2016-02-16 DIAGNOSIS — Z113 Encounter for screening for infections with a predominantly sexual mode of transmission: Secondary | ICD-10-CM | POA: Diagnosis not present

## 2016-02-16 DIAGNOSIS — Z23 Encounter for immunization: Secondary | ICD-10-CM

## 2016-02-16 DIAGNOSIS — J069 Acute upper respiratory infection, unspecified: Secondary | ICD-10-CM

## 2016-02-16 DIAGNOSIS — J029 Acute pharyngitis, unspecified: Secondary | ICD-10-CM | POA: Diagnosis not present

## 2016-02-16 LAB — HIV ANTIBODY (ROUTINE TESTING W REFLEX): HIV: NONREACTIVE

## 2016-02-16 LAB — POCT RAPID STREP A (OFFICE): RAPID STREP A SCREEN: NEGATIVE

## 2016-02-16 NOTE — Progress Notes (Signed)
Chief Complaint  Patient presents with  . Sore Throat    and hoarseness as well as cough. Was throwing up mucus today. Mucus is green and dark yellow. Symptoms started 48hrs ago. Her torso hurts. No headache but ears hurts. No sinus pain or pressure. Did take dayquil first day and now taking zycam chewable every three hrs. No fevers, chills or body aches.   Marland Kitchen STD    would also like to know if you would be able to do STD check today.    2 days ago she developed laryngitis; yesterday she started with body aches, face felt warm, hands felt cold, postnasal drainage, and right ear pain.  Slight cough--productive of green/yellow mucus. Denies any nasal drainage, sinus headaches. Throat hurts only when she coughs.  +strep exposure at work, no other sick contacts.  Works in a [employer]; [employer].  She has a boyfriend, plans to become sexually active with him in the near future. She is asking for STD tests, and had questions regarding contraceptive options.  She didn't like the [employer]. She is asking about Depo Provera.  She has had issues with decreased libido from OCP's. She always uses condoms, but prefers to use 2 forms of contraception.  She gets tested between every sexual partner.  Denies vaginal discharge Currently on her menses--declines pelvic exam or even giving urine specimen for chlamydia/GC screen. Wants bloodwork only  PMH, PSH ,SH reviewed  Outpatient Encounter Prescriptions as of 02/16/2016  Medication Sig  . buPROPion (WELLBUTRIN XL) 150 MG 24 hr tablet Take 150 mg by mouth daily.  . Homeopathic Products (ZICAM COLD REMEDY PO) Take by mouth.  . dicyclomine (BENTYL) 10 MG capsule Take 10 mg by mouth 2 (two) times daily.  . [DISCONTINUED] etonogestrel-ethinyl estradiol (NUVARING) 0.12-0.015 MG/24HR vaginal ring Place 1 each vaginally every 28 (twenty-eight) days. Insert vaginally and leave in place for 3 consecutive weeks, then remove for 1 week.   No facility-administered  encounter medications on file as of 02/16/2016.    Allergies  Allergen Reactions  . Rocephin [Ceftriaxone Sodium In Dextrose]     wheezing  . Reglan [Metoclopramide] Hives    Immunization History  Administered Date(s) Administered  . HPV 9-valent 06/28/2015, 08/11/2015  . Influenza,inj,Quad PF,36+ Mos 04/05/2015   ROS: see HPI re: URI symptoms.  No shortness of breath, wheezing, chest pain, nausea, vomiting, diarrhea, bleeding, bruising, rash, urinary complaints or other concerns.  PHYSICAL EXAM: BP 118/70 (BP Location: Left Arm, Patient Position: Sitting, Cuff Size: Normal)   Pulse 80   Temp 99 F (37.2 C) (Tympanic)   Ht 5' 8.5" (1.74 m)   Wt 138 lb 6.4 oz (62.8 kg)   BMI 20.74 kg/m   Well appearing, pleasant female, with hoarse voice, in no distress HEENT: PERRL, EOMI, conjunctiva and sclera are clear.  TM's and EAC's normal bilaterally; nasal mucosa is mildly edematous, no erythema or drainage.  Mildly tender over maxillary sinuses. OP clear, mild erythema posteriorly.  No exudate, tonsils normal Neck: mildly tender anterior lymphadenopathy, small Heart: regular rate and rhythm without murmur Lungs: clear bilaterally Skin: normal turgor, no rash Psych: normal mood, affect, hygiene and grooming  ASSESSMENT/PLAN:  Acute upper respiratory infection - viral.  supportive measures reviewed  Sore throat - normal exam, rapid strep negative.  Suspect viral/PND - Plan: Rapid Strep A  Screen for STD (sexually transmitted disease) - Plan: HIV antibody, RPR  Need for HPV vaccination - Plan: HPV 9-valent vaccine,Recombinat    HPV#3 today  to complete series Flu shot recommended when not sick.  Supportive measures discussed for URI symptoms.  Discussed Depo Provera in detail--she plans to discuss options further with her GYN. Encouraged ongoing regular condom use.. She plans to schedule appointment with GYN for pap and further discuss contraceptive options (she gets decreased  libido with many hormonal options/pills).   Drink plenty of water/fluids.  Use tylenol or ibuprofen as needed for fever or pain. Use decongestants (Dayquil, or sudafed) as needed for sinus pressure, ear discomfort, and to decrease the postnasal drainage. Use mucinex DM or Robitussin DM if the phlegm gets very thick or you develop a worsening cough. Return if ongoing fever, worsening symptoms, shortness of breath, or other new concerns.  I recommend getting chlamydia screen (can be asymptomatic).  You can do this with your next pap smear since we didn't do it today.  Return sooner for evaluation if you develop any abnormal vaginal discharge or pelvic pain.

## 2016-02-16 NOTE — Patient Instructions (Signed)
  Drink plenty of water/fluids.  Use tylenol or ibuprofen as needed for fever or pain. Use decongestants (Dayquil, or sudafed) as needed for sinus pressure, ear discomfort, and to decrease the postnasal drainage. Use mucinex DM or Robitussin DM if the phlegm gets very thick or you develop a worsening cough. Return if ongoing fever, worsening symptoms, shortness of breath, or other new concerns.  I recommend getting chlamydia screen (can be asymptomatic).  You can do this with your next pap smear since we didn't do it today.  Return sooner for evaluation if you develop any abnormal vaginal discharge or pelvic pain.  Yearly flu shots are recommended.  The last one given here was 03/2015 (last year)--if you didn't get one anywhere else, I recommend getting another one when you are well (no fever).

## 2016-02-17 LAB — RPR

## 2016-02-22 ENCOUNTER — Ambulatory Visit (INDEPENDENT_AMBULATORY_CARE_PROVIDER_SITE_OTHER): Payer: BLUE CROSS/BLUE SHIELD | Admitting: Family Medicine

## 2016-02-22 ENCOUNTER — Encounter: Payer: Self-pay | Admitting: Family Medicine

## 2016-02-22 VITALS — BP 120/78 | HR 71 | Temp 98.0°F

## 2016-02-22 DIAGNOSIS — J029 Acute pharyngitis, unspecified: Secondary | ICD-10-CM | POA: Diagnosis not present

## 2016-02-22 DIAGNOSIS — R059 Cough, unspecified: Secondary | ICD-10-CM

## 2016-02-22 DIAGNOSIS — R05 Cough: Secondary | ICD-10-CM

## 2016-02-22 LAB — POCT RAPID STREP A (OFFICE): RAPID STREP A SCREEN: NEGATIVE

## 2016-02-22 MED ORDER — AZITHROMYCIN 250 MG PO TABS: ORAL_TABLET | ORAL | 0 refills | Status: DC

## 2016-02-22 NOTE — Patient Instructions (Signed)
Take the antibiotic and let me know if you are not back to baseline after day 10. Drink plenty of fluids, mostly water. mucinex DM or Robitussin DM for cough. Use salt water gargles for throat discomfort.

## 2016-02-22 NOTE — Progress Notes (Signed)
Subjective: Chief Complaint  Patient presents with  . sick    cold for the last 5 days but drainage and coughing alot making her throat hurt     Rebecca Schmidt is a 20 y.o. female who presents for a 8 day history of acute URI. States her symptoms improved and she felt fine yesterday but then this morning she had an acute onset of worsening nasal congestion, severe sore throat and cough.  States cough is occasionally productive of clear-white sputum and is making her gag at times.   Does not smoke. Denies history of asthma, pneumonia, bronchitis.  Denies fever, chills, headache, neck pain, sinus pressure, rhinorrhea, ear pain, joint pain, chest pain, shortness of breath, abdominal pain, nausea, vomiting, diarrhea.  She saw Dr. Tomi Bamberger for acute URI on 02/16/2016 and was diagnosed with a viral URI and has been treating her symptoms. Had a neg strep at that time. Does admit to being exposed to someone with strep at school.   Treatment to date: Mucinex DM, cough lozenges.   No other aggravating or relieving factors.  No other c/o.  ROS as in subjective.   Objective: Vitals:   02/22/16 1024  BP: 120/78  Pulse: 71  Temp: 98 F (36.7 C)    General appearance: Alert, WD/WN, no distress, mildly ill appearing                             Skin: warm, no rash                           Head: no sinus tenderness                            Eyes: conjunctiva normal, corneas clear, PERRLA                            Ears: pearly TMs, external ear canals normal                          Nose: septum midline, turbinates swollen, with moderate erythema and clear discharge             Mouth/throat: MMM, tongue normal, moderate pharyngeal erythema, mild edema without exudate.                            Neck: supple, no adenopathy, no thyromegaly, nontender                          Heart: RRR, normal S1, S2, no murmurs                         Lungs: CTA bilaterally, no wheezes, rales, or rhonchi      Assessment: Acute pharyngitis, unspecified etiology  Cough  Rapid strep negative.   Plan: Will treat her with a Z-pak due to course of her illness. Also discussed symptomatic OTC remedies such as Mucinex DM for cough. Increase fluid intake.  Nasal saline spray for congestion. Tylenol or Ibuprofen OTC for fever and malaise and salt water gargles for sore throat.  Call/return if not improving. She is aware that the medication keeps working for 10 days.   Recommend she start  taking multi-vitamin, get adequate sleep, exercise and stay well hydrated to help her immune system and return when she is feeling well to get a flu shot.

## 2016-08-02 ENCOUNTER — Encounter: Payer: Self-pay | Admitting: Family Medicine

## 2016-08-02 ENCOUNTER — Ambulatory Visit (INDEPENDENT_AMBULATORY_CARE_PROVIDER_SITE_OTHER): Payer: BLUE CROSS/BLUE SHIELD | Admitting: Family Medicine

## 2016-08-02 VITALS — BP 120/82 | HR 75 | Temp 98.5°F | Wt 136.2 lb

## 2016-08-02 DIAGNOSIS — J029 Acute pharyngitis, unspecified: Secondary | ICD-10-CM | POA: Diagnosis not present

## 2016-08-02 DIAGNOSIS — R6889 Other general symptoms and signs: Secondary | ICD-10-CM | POA: Diagnosis not present

## 2016-08-02 LAB — POC INFLUENZA A&B (BINAX/QUICKVUE)
INFLUENZA A, POC: NEGATIVE
INFLUENZA B, POC: NEGATIVE

## 2016-08-02 LAB — POCT RAPID STREP A (OFFICE): RAPID STREP A SCREEN: NEGATIVE

## 2016-08-02 NOTE — Progress Notes (Signed)
Subjective: Chief Complaint  Patient presents with  . sick    symptoms started 2 days ago, headache, body aches, congestion. sore throat started this morning, no fever     Rebecca Schmidt is a 21 y.o. female who presents for a 2 day history of sore throat, rhinorrhea, nasal congestion, frontal headache, and body aches.   Denies fever, ear pain, cough, abdominal pain, N/V/D.   Treatment to date: zicam  and Tylenol.   Positive sick contacts.  No other aggravating or relieving factors.  No other c/o.  ROS as in subjective.   Objective: Vitals:   08/02/16 1520  BP: 120/82  Pulse: 75  Temp: 98.5 F (36.9 C)    General appearance: Alert, WD/WN, no distress, mildly ill appearing                             Skin: warm, no rash                           Head: no sinus tenderness                            Eyes: conjunctiva normal, corneas clear, PERRLA                            Ears: pearly TMs, external ear canals normal                          Nose: septum midline, turbinates swollen, with erythema and clear discharge             Mouth/throat: MMM, tongue normal, moderate pharyngeal erythema without edema                           Neck: supple, no adenopathy, no thyromegaly, nontender                          Heart: RRR, normal S1, S2, no murmurs                         Lungs: CTA bilaterally, no wheezes, rales, or rhonchi      Assessment: Flu-like symptoms - Plan: POC Influenza A&B(BINAX/QUICKVUE)  Acute pharyngitis, unspecified etiology - Plan: POCT rapid strep A    Plan: Flu swab negative.  Rapid strep negative. Discussed diagnosis and treatment of URI.  Suggested symptomatic OTC remedies. Salt water gargles.  Nasal saline spray for congestion.  Tylenol or Ibuprofen OTC for fever and malaise.  Call/return in 2-3 days if symptoms aren't resolving.

## 2016-08-02 NOTE — Patient Instructions (Signed)
Your flu swab and strep swabs are negative.  I suspect your symptoms are related to a viral illness and recommend treating your symptoms at this point.  Mucinex DM for congestion and cough, drink extra water, use salt water gargles for throat irritation and Tylenol or Ibuprofen for aches and pains.  Call if you are not improving by days 7-10 of your illness or if you develop fever, wheezing or worsening symptoms.

## 2018-02-06 DIAGNOSIS — Z3009 Encounter for other general counseling and advice on contraception: Secondary | ICD-10-CM | POA: Diagnosis not present

## 2018-02-20 ENCOUNTER — Ambulatory Visit (INDEPENDENT_AMBULATORY_CARE_PROVIDER_SITE_OTHER): Payer: 59 | Admitting: Family Medicine

## 2018-02-20 ENCOUNTER — Encounter: Payer: Self-pay | Admitting: Family Medicine

## 2018-02-20 VITALS — BP 102/72 | HR 81 | Temp 98.5°F | Resp 16 | Wt 145.8 lb

## 2018-02-20 DIAGNOSIS — J069 Acute upper respiratory infection, unspecified: Secondary | ICD-10-CM

## 2018-02-20 NOTE — Patient Instructions (Signed)
I suspect your symptoms are related to a viral illness and recommend treating your symptoms at this point. You can take the Norel AD (1 tablet every 4-6 hours) but do not duplicate the same medications with Dayquil or others.   Mucinex is good for congestion and cough, drink extra water, use salt water gargles for throat irritation and Tylenol or Ibuprofen for aches and pains.  Call if you are not improving by days 7-10 of your illness or if you develop high fever, wheezing or worsening symptoms.

## 2018-02-20 NOTE — Progress Notes (Signed)
Chief Complaint  Patient presents with  . cold    cold, snuffy nose, joint aches, sore throat, sinus pressure, headache symptoms started 2 days ago but thinks she had flu symptoms, fevers off and on for a week    Subjective:  Rebecca Schmidt is a 22 y.o. female who presents for a 4-5 day history of nausea, body aches, feeling feverish, rhinorrhea, nasal congestion, sore throat.   Denies dizziness, ear pain, chest pain, shortness of breath, cough, abdominal pain, V/D, urinary symptoms.   Treatment to date: Dayquil, Nyquil.  Denies sick contacts.  No other aggravating or relieving factors.  No other c/o.  LMP: 10 days ago.   ROS as in subjective.   Objective: Vitals:   02/20/18 1102  BP: 102/72  Pulse: 81  Resp: 16  Temp: 98.5 F (36.9 C)  SpO2: 97%    General appearance: Alert, WD/WN, no distress, mildly ill appearing                             Skin: warm, no rash                           Head: no sinus tenderness                            Eyes: conjunctiva normal, corneas clear, PERRLA                            Ears: pearly TMs, external ear canals normal                          Nose: septum midline, turbinates swollen, with erythema and clear discharge             Mouth/throat: MMM, tongue normal, mild pharyngeal erythema                           Neck: supple, no adenopathy, no thyromegaly, nontender                          Heart: RRR, normal S1, S2, no murmurs                         Lungs: CTA bilaterally, no wheezes, rales, or rhonchi      Assessment: Acute URI   Plan: Discussed diagnosis and treatment of URI and most likely this is a viral etiology.  Norel AD samples given #8. Suggested symptomatic OTC remedies. Salt water gargles.  Nasal saline spray or saline rinse for congestion.  Tylenol or Ibuprofen OTC for fever and malaise.  Call/return in 2-3 days if symptoms aren't resolving.

## 2018-03-29 DIAGNOSIS — Z6821 Body mass index (BMI) 21.0-21.9, adult: Secondary | ICD-10-CM | POA: Diagnosis not present

## 2018-03-29 DIAGNOSIS — Z01419 Encounter for gynecological examination (general) (routine) without abnormal findings: Secondary | ICD-10-CM | POA: Diagnosis not present

## 2018-06-06 ENCOUNTER — Ambulatory Visit: Payer: 59 | Admitting: Family Medicine

## 2018-06-06 ENCOUNTER — Encounter: Payer: Self-pay | Admitting: Family Medicine

## 2018-06-06 VITALS — BP 120/70 | HR 72 | Temp 98.2°F | Resp 16 | Wt 148.0 lb

## 2018-06-06 DIAGNOSIS — J36 Peritonsillar abscess: Secondary | ICD-10-CM | POA: Diagnosis not present

## 2018-06-06 DIAGNOSIS — M545 Low back pain, unspecified: Secondary | ICD-10-CM

## 2018-06-06 DIAGNOSIS — J351 Hypertrophy of tonsils: Secondary | ICD-10-CM

## 2018-06-06 DIAGNOSIS — Z9189 Other specified personal risk factors, not elsewhere classified: Secondary | ICD-10-CM | POA: Diagnosis not present

## 2018-06-06 LAB — POCT URINALYSIS DIP (PROADVANTAGE DEVICE)
Bilirubin, UA: NEGATIVE
Blood, UA: NEGATIVE
Glucose, UA: NEGATIVE mg/dL
Ketones, POC UA: NEGATIVE mg/dL
Leukocytes, UA: NEGATIVE
Nitrite, UA: NEGATIVE
PH UA: 6 (ref 5.0–8.0)
Protein Ur, POC: 30 mg/dL — AB
Specific Gravity, Urine: 1.03
Urobilinogen, Ur: NEGATIVE

## 2018-06-06 LAB — POCT URINE PREGNANCY: PREG TEST UR: NEGATIVE

## 2018-06-06 LAB — POCT RAPID STREP A (OFFICE): Rapid Strep A Screen: NEGATIVE

## 2018-06-06 MED ORDER — CLINDAMYCIN HCL 300 MG PO CAPS: 300.0000 mg | ORAL_CAPSULE | Freq: Three times a day (TID) | ORAL | 0 refills | Status: DC

## 2018-06-06 NOTE — Patient Instructions (Addendum)
Take the antibiotic as prescribed.  Make sure you take a probiotic for the next 2 weeks If you develop worsening swelling, difficulty swallowing, breathing or high fever then you need to go to the emergency department.  Call me tomorrow and let me know how you are doing. If you are worse then I will want to see you or possibly need to get you in with an ear, nose and throat specialist.       Peritonsillar Abscess  A peritonsillar abscess is a collection of pus in the back of the throat, behind the tonsils. It usually occurs when an infection of the throat or tonsils (tonsillitis) spreads into the tissues around the tonsils. What are the causes? The infection that leads to a peritonsillar abscess is usually caused by streptococcal bacteria. What increases the risk? You are more likely to develop this condition if:  You have recently been diagnosed with an infection in your mouth or throat.  You smoke.  You have gum disease or gingivitis (periodontal disease). What are the signs or symptoms? Symptoms of this condition include:  A sore throat, often with pain on just one side.  Swollen, tender glands (lymph nodes) in the neck.  Difficulty swallowing.  Difficulty opening your mouth.  Fever.  Chills.  Drooling because of difficulty swallowing saliva.  Headache.  Changes in how your voice sounds.  Bad breath. How is this diagnosed? This condition may be diagnosed based on:  Your symptoms and medical history.  A physical exam.  Imaging tests, such as ultrasound or CT scan.  Testing a pus sample from the abscess. Your health care provider may collect a pus sample by swabbing the back of your throat or by removing some pus with a syringe and needle (needle aspiration). How is this treated? Treatment usually involves draining the pus from the abscess. This may be done through needle aspiration or by making an incision in the abscess and draining the fluid. You will also  likely need to take antibiotic medicine. Follow these instructions at home: Medicines  Take over-the-counter and prescription medicines only as told by your health care provider.  If you were prescribed an antibiotic, take it as told by your health care provider. Do not stop taking the antibiotic even if your condition improves. Eating and drinking   Drink enough fluid to keep your urine pale yellow.  While your throat is sore, try only drinking liquids or eating only soft-textured foods such as yogurt and ice cream. General instructions  Rest as much as possible and get plenty of sleep.  Return to your normal activities as told by your health care provider. Ask your health care provider what activities are safe for you.  If your abscess was drained, gargle with a salt-water mixture 3-4 times a day or as needed. To make a salt-water mixture, completely dissolve -1 tsp of salt in 1 cup of warm water. Do not swallow this mixture.  Do not use any products that contain nicotine or tobacco, such as cigarettes and e-cigarettes. If you need help quitting, ask your health care provider.  Keep all follow-up visits as told by your health care provider. This is important. Contact a health care provider if you have:  More pain, swelling, redness, or pus in your throat.  A headache.  Lack of energy (lethargy).  A general feeling of illness (malaise).  A fever.  Dizziness.  Trouble swallowing.  Trouble eating.  Signs of dehydration, such as: ? Light-headedness when standing. ?  Urinating less than usual. ? A fast heart rate. ? Dry mouth. Get help right away if you:  Have trouble talking.  Have trouble breathing, or it is easier for you to breathe when you lean forward.  Cough up blood or vomit blood.  Have severe throat pain that does not get better with medicine. Summary  A peritonsillar abscess is a collection of pus in the back of the throat. It usually occurs when an  infection of the throat or tonsils spreads.  Symptoms include a sore throat, difficulty swallowing, fever, chills, and occasional drooling.  This condition is treated by draining the abscess and taking antibiotic medicine.  Call your health care provider if you have trouble swallowing or eating after treatment.  Get help right away if you vomit blood or cough up blood after you receive treatment. This information is not intended to replace advice given to you by your health care provider. Make sure you discuss any questions you have with your health care provider. Document Released: 05/15/2005 Document Revised: 03/29/2017 Document Reviewed: 03/29/2017 Elsevier Interactive Patient Education  2019 [employer].

## 2018-06-06 NOTE — Progress Notes (Signed)
Chief Complaint  Patient presents with  . swollen throat    swollen throat, x2 day,     Subjective:  Rebecca Schmidt is a 23 y.o. female who presents for a 2 day history of swollen throat, no pain. Denies having difficulty swallowing.   States 4 days ago she had unprotected sex with a new partner. States she had oral and vaginal sex. Would like STD testing and pregnancy test. States she stopped taking birth control pills last month. No reason given.   Denies fever, chills, headache, rhinorrhea, nasal congestion, ear pain, cough, chest pain, palpitations, shortness of breath, abdominal pain, N/V/D, urinary symptoms, vaginal discharge.   LMP: 2 weeks ago.  Treatment to date: Tylenol.  Denies sick contacts.  No other aggravating or relieving factors.  No other c/o.  ROS as in subjective.   Objective: Vitals:   06/06/18 1501  BP: 120/70  Pulse: 72  Resp: 16  Temp: 98.2 F (36.8 C)  SpO2: 98%    General appearance: Alert, WD/WN, no distress, mildly ill appearing                             Skin: warm, no rash                           Head: no sinus tenderness                            Eyes: conjunctiva normal, corneas clear, PERRLA                            Ears: pearly TMs, external ear canals normal                          Nose: septum midline, turbinates swollen, with erythema and clear discharge             Mouth/throat: MMM, tongue normal, mild pharyngeal erythema                           Neck: supple, no adenopathy, no thyromegaly, nontender                          Heart: RRR, normal S1, S2, no murmurs                         Lungs: CTA bilaterally, no wheezes, rales, or rhonchi      Assessment: Peritonsillar abscess - Plan: clindamycin (CLEOCIN) 300 MG capsule  Swollen tonsil - Plan: POCT rapid strep A, Ct/GC NAA, Pharyngeal  At risk for sexually transmitted disease due to unprotected sex - Plan: RPR, HIV Antibody (routine testing w rflx), GC/Chlamydia Probe  Amp, POCT urine pregnancy, Beta hCG quant (ref lab), Ct/GC NAA, Pharyngeal  Acute bilateral low back pain without sciatica - Plan: POCT Urinalysis DIP (Proadvantage Device)    Plan: Rapid strep- negative  UPT negative UA- negative  Screen for STD per patient request and recent unprotected sex. Safe sex counseling done.  Discussed that she has a peritonsillar abscess. Currently this is mild and she is afebrile, no difficulty with her airway or with swallowing. Dr. Redmond School also examined this patient and agrees with plan of  care. Treat with Clindamycin due to allergies. She will take a probiotic. Educated her on possible side effect of GI upset and C-diff.  Strict precaution that if she worsens that she should be seen immediately and that may mean going to the ED if after hours. She verbalized understanding.  Follow up with me tomorrow by phone or in office.

## 2018-06-07 ENCOUNTER — Telehealth: Payer: Self-pay | Admitting: Family Medicine

## 2018-06-07 LAB — BETA HCG QUANT (REF LAB): hCG Quant: <1 m[IU]/mL

## 2018-06-07 LAB — RPR: RPR Ser Ql: NONREACTIVE

## 2018-06-07 LAB — HIV ANTIBODY (ROUTINE TESTING W REFLEX): HIV Screen 4th Generation wRfx: NONREACTIVE

## 2018-06-07 NOTE — Telephone Encounter (Signed)
Pt called and stated that her symptoms did not get worse. She stated that she did not have a fever and her throat was slightly swollen. Her discomfort goes away a little with tylenol. Pt was advised to call us back if symptoms got worse or if she had anymore questions or concerns.  Rebecca Schmidt can be reached at [phone]

## 2018-06-08 ENCOUNTER — Emergency Department (HOSPITAL_COMMUNITY)
Admission: EM | Admit: 2018-06-08 | Discharge: 2018-06-08 | Disposition: A | Discharge status: Home / Self Care | Payer: 59 | Attending: Emergency Medicine | Admitting: Emergency Medicine

## 2018-06-08 ENCOUNTER — Encounter (HOSPITAL_COMMUNITY): Payer: Self-pay

## 2018-06-08 ENCOUNTER — Other Ambulatory Visit: Payer: Self-pay

## 2018-06-08 DIAGNOSIS — Z79899 Other long term (current) drug therapy: Secondary | ICD-10-CM | POA: Insufficient documentation

## 2018-06-08 DIAGNOSIS — J029 Acute pharyngitis, unspecified: Secondary | ICD-10-CM | POA: Insufficient documentation

## 2018-06-08 LAB — GC/CHLAMYDIA PROBE AMP
Chlamydia trachomatis, NAA: NEGATIVE
Neisseria gonorrhoeae by PCR: NEGATIVE

## 2018-06-08 LAB — CT/GC NAA, PHARYNGEAL
C TRACH RRNA NPH QL PCR: NEGATIVE
N GONORRHOEA RRNA NPH QL PCR: NEGATIVE

## 2018-06-08 MED ORDER — DEXAMETHASONE SODIUM PHOSPHATE 10 MG/ML IJ SOLN
10.0000 mg | Freq: Once | INTRAMUSCULAR | Status: AC
  Administered 2018-06-08: 10 mg via INTRAMUSCULAR
  Filled 2018-06-08: qty 1

## 2018-06-08 NOTE — ED Triage Notes (Signed)
Pt was diagnosed with a peritonsillar abscess on 1/9. Pt states that it is uncomfortable and feels like it is pressing on her uvula, making her nauseous. She was started on an antibiotic. Pt is speaking in complete sentences, no respiratory distress noted.

## 2018-06-08 NOTE — ED Provider Notes (Signed)
[facility] DEPT Provider Note   CSN: [id number] Arrival date & time: 06/08/18  0146     History   Chief Complaint Chief Complaint  Patient presents with  . Sore Throat    HPI Rebecca Schmidt is a 23 y.o. female with history of IBS presenting for evaluation of acute onset, progressively worsening sore throat for 6 days.  She denies significant pain but feels as though there is a pressure to her throat, worse on the left.  She went to see her PCP on Thursday, 04/07/2019 and was diagnosed with a peritonsillar abscess and discharged with clindamycin due to Rocephin allergy.  She has had 6 doses of this thus far.  Several hours ago after eating pizza drinking soda she began to feel as though she was having some difficulty swallowing and as though something was stuck in her throat.  She reports that this sensation has significantly improved.  She denies drooling, shortness of breath, chest pain, vomiting, nausea, or high fevers.  She has been taking Tylenol with some relief.  Denies neck stiffness or neck pain.  The history is provided by the patient.    Past Medical History:  Diagnosis Date  . IBS (irritable bowel syndrome)     Patient Active Problem List   Diagnosis Date Noted  . Family history of hypercholesterolemia 06/28/2015  . Family history of melanoma 06/28/2015  . Frequent headaches 04/05/2015  . Irritable bowel syndrome (IBS) 10/01/2013    Past Surgical History:  Procedure Laterality Date  . COLONOSCOPY     normal     OB History   No obstetric history on file.      Home Medications    Prior to Admission medications   Medication Sig Start Date End Date Taking? Authorizing Provider  acetaminophen (TYLENOL) 500 MG tablet Take 500 mg by mouth every 6 (six) hours as needed for mild pain or headache.   Yes [provider]  acidophilus (RISAQUAD) CAPS capsule Take 1 capsule by mouth daily.   Yes [provider]    clindamycin (CLEOCIN) 300 MG capsule Take 1 capsule (300 mg total) by mouth 3 (three) times daily. 06/06/18  Yes Henson, Vickie L, NP-C    Family History Family History  Problem Relation Age of Onset  . Depression Mother   . Irritable bowel syndrome Mother   . Cancer Mother        skin  . Cancer Father   . Diabetes Father   . Hypertension Father   . Crohn's disease Maternal Grandfather   . Cancer Maternal Grandfather   . Depression Brother     Social History Social History   Tobacco Use  . Smoking status: Never Smoker  . Smokeless tobacco: Never Used  Substance Use Topics  . Alcohol use: Yes    Comment: rare  . Drug use: No     Allergies   Rocephin [ceftriaxone sodium in dextrose] and Reglan [metoclopramide]   Review of Systems Review of Systems  Constitutional: Negative for chills and fever.  HENT: Positive for sore throat and trouble swallowing (resolved).   Respiratory: Negative for cough and shortness of breath.   Cardiovascular: Negative for chest pain.  Gastrointestinal: Negative for abdominal pain, nausea and vomiting.  All other systems reviewed and are negative.    Physical Exam Updated Vital Signs BP 122/88 (BP Location: Left Arm)   Pulse 66   Temp 98.1 F (36.7 C) (Oral)   Resp 12   LMP 05/23/2018  SpO2 100%   Physical Exam Vitals signs and nursing note reviewed.  Constitutional:      General: She is not in acute distress.    Appearance: She is well-developed.  HENT:     Head: Normocephalic and atraumatic.     Right Ear: Tympanic membrane and ear canal normal. No drainage or tenderness. No middle ear effusion. Tympanic membrane is not erythematous.     Left Ear: Tympanic membrane and ear canal normal. No drainage or tenderness.  No middle ear effusion. Tympanic membrane is not erythematous.     Nose: No congestion or rhinorrhea.     Mouth/Throat:     Mouth: Mucous membranes are moist.     Tonsils: Swelling: 3+ on the left.     Comments:  Posterior oropharynx with erythema especially about the tonsillar pillars bilaterally.  Tonsillar hypertrophy on the left, no uvular deviation.  Exudates noted on the tonsils bilaterally.  Airway appears patent.  Patient tolerating secretions without difficulty.  No abnormal phonation.  No sublingual abnormalities. Eyes:     General:        Right eye: No discharge.        Left eye: No discharge.     Conjunctiva/sclera: Conjunctivae normal.  Neck:     Musculoskeletal: Normal range of motion and neck supple.     Thyroid: No thyromegaly.     Vascular: No JVD.     Trachea: No tracheal deviation.  Cardiovascular:     Rate and Rhythm: Normal rate.     Heart sounds: Normal heart sounds.  Pulmonary:     Effort: Pulmonary effort is normal.     Breath sounds: Normal breath sounds.  Abdominal:     General: There is no distension.     Palpations: Abdomen is soft.     Tenderness: There is no abdominal tenderness. There is no guarding.  Lymphadenopathy:     Cervical: No cervical adenopathy.  Skin:    General: Skin is warm and dry.     Findings: No erythema.  Neurological:     Mental Status: She is alert.  Psychiatric:        Behavior: Behavior normal.      ED Treatments / Results  Labs (all labs ordered are listed, but only abnormal results are displayed) Labs Reviewed - No data to display  EKG None  Radiology No results found.  Procedures Procedures (including critical care time)  Medications Ordered in ED Medications  dexamethasone (DECADRON) injection 10 mg (10 mg Intramuscular Given 06/08/18 0650)     Initial Impression / Assessment and Plan / ED Course  I have reviewed the triage vital signs and the nursing notes.  Pertinent labs & imaging results that were available during my care of the patient were reviewed by me and considered in my medical decision making (see chart for details).     Patient presenting for evaluation of sore throat.  She is afebrile, vital  signs are stable.  She is nontoxic in appearance.  She is tolerating secretions without difficulty.  No facial swelling, no neck stiffness.  No evidence of Ludwig angina, or retropharyngeal abscess.  She does have swelling of the left tonsil but no significant peritonsillar swelling.  Presentation consistent with phlegmonous pharyngitis.  She was seen by her PCP 2 days ago who put her on clindamycin and she had negative strep test and negative STD testing.  She was given Decadron with some improvement in symptoms.  She is able to drink water  in the ED without difficulty.  Airway appears entirely patent no evidence of compromise.  No evidence of angioedema or allergic reaction.  Stable for discharge home with follow-up with ENT for reevaluation of symptoms.  Discussed strict ED return precautions.  Patient and friend verbalized understanding of and agreement with plan and patient stable for discharge home at this time.  Final Clinical Impressions(s) / ED Diagnoses   Final diagnoses:  Phlegmonous pharyngitis    ED Discharge Orders    None       Debroah Baller 06/08/18 [id number]    Palumbo, April, MD 06/08/18 0745

## 2018-06-08 NOTE — Discharge Instructions (Signed)
Please take all of your antibiotics until finished!   You may develop abdominal discomfort or diarrhea from the antibiotic.  You may help offset this with probiotics which you can buy or get in yogurt. Do not eat  or take the probiotics until 2 hours after your antibiotic.   Alternate 600 mg of ibuprofen and [phone] mg of Tylenol every 3 hours as needed for pain. Do not exceed 4000 mg of Tylenol daily.  Take ibuprofen with food to avoid upset stomach issues.  You may also find it helpful to take Tums or Pepcid as needed for acid reflux.  Avoid fried foods, spicy foods, fatty foods, or alcohol which can make your symptoms worse.  Eat a diet of soft bland foods.  Drink plenty of water and get plenty of rest  Follow-up with ENT for reevaluation of symptoms.  Return to the emergency department if any concerning signs or symptoms develop such as voice change, difficulty breathing, drooling, spitting out saliva, neck stiffness

## 2018-06-17 ENCOUNTER — Encounter: Payer: Self-pay | Admitting: Family Medicine

## 2018-06-17 ENCOUNTER — Ambulatory Visit (INDEPENDENT_AMBULATORY_CARE_PROVIDER_SITE_OTHER): Payer: 59 | Admitting: Family Medicine

## 2018-06-17 VITALS — BP 110/74 | HR 101 | Temp 98.5°F | Resp 16 | Ht 69.0 in | Wt 151.6 lb

## 2018-06-17 DIAGNOSIS — J36 Peritonsillar abscess: Secondary | ICD-10-CM | POA: Insufficient documentation

## 2018-06-17 MED ORDER — CLINDAMYCIN HCL 300 MG PO CAPS: 300.0000 mg | ORAL_CAPSULE | Freq: Three times a day (TID) | ORAL | 0 refills | Status: DC

## 2018-06-17 NOTE — Progress Notes (Signed)
Chief Complaint  Patient presents with  . throat    throat still swollen, low grade fever     Subjective:  Rebecca Schmidt is a 23 y.o. female who presents for evaluation of sore throat and swollen left tonsil.  She has not had a recent close exposure to someone with proven streptococcal pharyngitis.  Associated symptoms include a recurrent swollen left tonsil, sore throat and low grade fever x 2 days. She reports being back to baseline for 36 hours after completing course of Clindamycin for peritonsillar abscess.  Reports history of chronic sore throat and tonsil swelling.  Negative strep and STD testing at her previous visit.   No rhinorrhea, nasal congestion, ear pain, sinus pain, post nasal drainage, cough, abdominal pain, N/V/D.   Treatment to date: antibiotics.  ? sick contacts.  No other aggravating or relieving factors.  No other c/o.  The following portions of the patient's history were reviewed and updated as appropriate: allergies, current medications, past medical history, past social history, past surgical history and problem list.  ROS as in subjective   Objective: Vitals:   06/17/18 1617  BP: 110/74  Pulse: (!) 101  Resp: 16  Temp: 98.5 F (36.9 C)  SpO2: 98%    General appearance: no distress, WD/WN, mildly ill-appearing HEENT: normocephalic, conjunctiva/corneas normal, sclerae anicteric, nares patent, no discharge or erythema, pharynx with erythema, left tonsil 3+, no exudate. Right tonsil is normal. Uvula is not displaced.  Oral cavity: MMM Neck: supple, no lymphadenopathy, no thyromegaly Heart: RRR, normal S1, S2, no murmurs Lungs: CTA bilaterally, no wheezes, rhonchi, or rales     Assessment and Plan: Peritonsillar abscess   Discussed that she now has a recurrent peritonsillar abscess. Currently this is mild and she is afebrile, no difficulty with her airway or with swallowing. Will put her back on Clindamycin due to allergies. She will take a  probiotic. Educated her on possible side effect of GI upset and C-diff. She will see Dr. Benjamine Mola, ENT in 2 days.  Strict precaution that if she worsens that she should be seen immediately and that may mean going to the ED if after hours. She verbalized understanding.

## 2018-08-01 ENCOUNTER — Ambulatory Visit (INDEPENDENT_AMBULATORY_CARE_PROVIDER_SITE_OTHER): Payer: 59 | Admitting: Family Medicine

## 2018-08-01 ENCOUNTER — Encounter: Payer: Self-pay | Admitting: Family Medicine

## 2018-08-01 VITALS — BP 122/80 | HR 76 | Temp 97.9°F | Wt 150.6 lb

## 2018-08-01 DIAGNOSIS — R21 Rash and other nonspecific skin eruption: Secondary | ICD-10-CM

## 2018-08-01 NOTE — Patient Instructions (Signed)
Keep your skin well moisturized.  Try using an over-the-counter hydrocortisone 1%.  Never use this more than 14 days

## 2018-08-01 NOTE — Progress Notes (Signed)
   Subjective:    Patient ID: Rebecca Schmidt, female    DOB: 1995-06-09, 23 y.o.   MRN: [id number]  HPI Chief Complaint  Patient presents with  . sore throat    sore throat, went to ENT- was told removal of tonsils but its over 6 weeks before surgery but ENT didn't put her back on med.  so she is scheduled with another ENT in 2 weeks-    She would like to address dry skin on her right lower leg. Has been using a hemp lotion. No other rash.  Somewhat pruritic. Reports history of eczema.   Has an appointment with ENT tomorrow for chronic tonsillitis. This was scheduled by my CMA, Sabrina.   Denies fever, chills, N/V/D.    Review of Systems Pertinent positives and negatives in the history of present illness.     Objective:   Physical Exam BP 122/80   Pulse 76   Temp 97.9 F (36.6 C) (Oral)   Wt 150 lb 9.6 oz (68.3 kg)   BMI 22.24 kg/m   Dry patch with mild cracking of her skin on her right medial lower leg without sign of infection.       Assessment & Plan:  Rash  Discussed that this appears to be consistent with an eczema type rash. Use a good eczema lotion such as Aveeno and try OTC hydrocortisone. Follow up as needed.

## 2018-12-17 ENCOUNTER — Ambulatory Visit (INDEPENDENT_AMBULATORY_CARE_PROVIDER_SITE_OTHER): Payer: 59 | Admitting: Family Medicine

## 2018-12-17 ENCOUNTER — Encounter: Payer: Self-pay | Admitting: Family Medicine

## 2018-12-17 ENCOUNTER — Other Ambulatory Visit: Payer: Self-pay

## 2018-12-17 VITALS — Wt 145.0 lb

## 2018-12-17 DIAGNOSIS — J312 Chronic pharyngitis: Secondary | ICD-10-CM

## 2018-12-17 DIAGNOSIS — K219 Gastro-esophageal reflux disease without esophagitis: Secondary | ICD-10-CM

## 2018-12-17 NOTE — Progress Notes (Signed)
   Subjective:  Documentation for virtual telephone encounter.  Documentation for virtual audio and video telecommunications through [facility] encounter:  The patient was located at home.  2 patient identifiers used. The provider was located at home. The patient did consent to this visit and is aware of possible charges through their insurance for this visit.  The other persons participating in this telemedicine service were none.     Patient ID: Rebecca Schmidt, female    DOB: [birth year], 23 y.o.   MRN: [id number]  HPI Chief Complaint  Patient presents with  . tonsil swelling up again    went to ENT and was told it was GERD but and was put on med with diet and swelling went down with tonsil, now flaring back up and not sure if its GERD or tonsitis. currently back on the med but doesn't know the name of it cause shes not home   Complains of ongoing pharyngitis that seems to be worsening. No white patches on her tonsils but she does note them being more swollen especially on the left.  She has been treated by ENT for allergic rhinitis as well as GERD. She has not followed up with ENT. States ENT told her her throat issues are related to GERD.   Taking Pepcid. Avoiding triggers for reflux. States she fell off the GERD diet for a bit but now doing it again. States she cannot prop herself up because she cannot fall asleep that well. Not eating for 5 hours before laying down.   Denies fever, chills, headache, rhinorrhea, nasal congestion, ear pain, cough, PND, N/V/D.    Reviewed allergies, medications, past medical, surgical, family, and social history.   Review of Systems Pertinent positives and negatives in the history of present illness.     Objective:   Physical Exam Wt 145 lb (65.8 kg)   BMI 21.41 kg/m   Alert and oriented and in no acute distress. Normal speech. No difficulty swallowing or breathing. Cervical lymph nodes nontender when she palpates.       Assessment &  Plan:  Gastroesophageal reflux disease, esophagitis presence not specified - Plan: Ambulatory referral to Gastroenterology, avoiding triggers, taking Pepcid. No improvement. ENT feels that GERD is causing her chronic pharyngitis. She will take Pepcid bid for now.   Pharyngitis, chronic - Plan: Ambulatory referral to Gastroenterology, per ENT, this is caused by GERD. She has not improved significantly with GERD lifestyle management and Pepcid. Referral for further evaluation.  No sign of infectious process. No other URI symptoms. No indication for antibiotic or steroids.    Time spent on call was 13 minutes and in review of previous records 2 minutes total.  This virtual service is not related to other E/M service within previous 7 days.

## 2019-01-02 ENCOUNTER — Other Ambulatory Visit: Payer: Self-pay

## 2019-01-02 ENCOUNTER — Encounter: Payer: Self-pay | Admitting: Family Medicine

## 2019-01-02 ENCOUNTER — Ambulatory Visit (INDEPENDENT_AMBULATORY_CARE_PROVIDER_SITE_OTHER): Payer: 59 | Admitting: Family Medicine

## 2019-01-02 VITALS — Ht 69.0 in | Wt 145.0 lb

## 2019-01-02 DIAGNOSIS — J029 Acute pharyngitis, unspecified: Secondary | ICD-10-CM

## 2019-01-02 DIAGNOSIS — R52 Pain, unspecified: Secondary | ICD-10-CM | POA: Diagnosis not present

## 2019-01-02 DIAGNOSIS — R509 Fever, unspecified: Secondary | ICD-10-CM

## 2019-01-02 MED ORDER — PREDNISONE 20 MG PO TABS: ORAL_TABLET | ORAL | 0 refills | Status: DC

## 2019-01-02 NOTE — Progress Notes (Signed)
   Subjective:   Documentation for virtual telephone encounter.  Documentation for virtual audio and video telecommunications through [place] encounter:  The patient was located at home.  2 patient identifiers were used. The provider was located in the office. The patient did consent to this visit and is aware of possible charges through their insurance for this visit.  The other persons participating in this telemedicine service were none.    Patient ID: Rebecca Schmidt, female    DOB: 20-Jan-1996, 23 y.o.   MRN: [id number]  HPI Chief Complaint  Patient presents with  . Sore Throat   Complains of a 2 day history of a sore and swollen throat. White patches on her tonsils.  Denies strep exposure.  She also complains of low-grade fever, body aches.  No known COVID exposure.  Denies having any difficulty swallowing, breathing.  Denies chest pain, palpitations, shortness of breath, cough, abdominal pain, nausea, vomiting, diarrhea, urinary symptoms.  Denies loss of taste or smell.  She has not taken anything for her symptoms.  States she is moving to [place] next Monday.  Reviewed allergies, medications, past medical, surgical, family, and social history.    Review of Systems Pertinent positives and negatives in the history of present illness.     Objective:   Physical Exam Ht 5\' 9"  (1.753 m)   Wt 145 lb (65.8 kg)   BMI 21.41 kg/m   Alert and oriented and in no acute distress. Airway patent but unable to appreciate pharyngeal area otherwise due to poor video quality. No issues with breathing, swallowing or speaking. Normal speech.       Assessment & Plan:  Acute pharyngitis, unspecified etiology - Plan: predniSONE (DELTASONE) 20 MG tablet, I will prescribe 1 day high-dose steroids.  Advised to take Tylenol or ibuprofen, salt water gargles and stay well-hydrated.  Cannot rule out COVID-19 so she will go in the morning for testing to the [facility].   Encouraged her to stay quarantined until symptoms resolve and/or she has a negative test result.  Low grade fever - Plan: See above note  Body aches - Plan: See above note  Time spent on call was 14 minutes and in review of previous records 1 minutes total.  This virtual service is not related to other E/M service within previous 7 days.

## 2019-01-03 ENCOUNTER — Other Ambulatory Visit: Payer: Self-pay

## 2019-01-03 DIAGNOSIS — Z20822 Contact with and (suspected) exposure to covid-19: Secondary | ICD-10-CM

## 2019-01-04 LAB — NOVEL CORONAVIRUS, NAA: SARS-CoV-2, NAA: NOT DETECTED

## 2019-03-05 ENCOUNTER — Ambulatory Visit (INDEPENDENT_AMBULATORY_CARE_PROVIDER_SITE_OTHER): Payer: 59 | Admitting: Family Medicine

## 2019-03-05 ENCOUNTER — Encounter: Payer: Self-pay | Admitting: Family Medicine

## 2019-03-05 ENCOUNTER — Other Ambulatory Visit: Payer: Self-pay

## 2019-03-05 VITALS — Wt 143.0 lb

## 2019-03-05 DIAGNOSIS — F4323 Adjustment disorder with mixed anxiety and depressed mood: Secondary | ICD-10-CM

## 2019-03-05 DIAGNOSIS — K589 Irritable bowel syndrome without diarrhea: Secondary | ICD-10-CM | POA: Diagnosis not present

## 2019-03-05 NOTE — Progress Notes (Signed)
Subjective:  Documentation for virtual audio and video telecommunications through [place] encounter:  The patient was located at a friends house. 2 patient identifiers used.  The provider was located in the office. The patient did consent to this visit and is aware of possible charges through their insurance for this visit.  The other persons participating in this telemedicine service were none.    Patient ID: Rebecca Schmidt, female    DOB: May 18, 1996, 23 y.o.   MRN: [id number]  HPI Chief Complaint  Patient presents with  . anxiety issue    anxiety issues- having trouble sleeping as well, haven't found a therapist yet, got worse since moving in with a new roomate. was on wellbutrin but came off over a year ago   Complains of anxiety and depression.  She and I have not discussed this in the past. States she has been dealing with anxiety and depression for 4 years.  States she has been seeing a Social worker.  She recently moved to [place] to attend [employer].  States she had to find off campus lodging and was put with a roommate that she did not know. States the roommate has addiction problems and using drugs in the room which is causing her anxiety.  States she brought this to the attention of the landlord and they told her that they could not do anything about this and that she would need to contact the police. States she will be moving back to [place] and continuing her classes online.  States she is moving in the next 2 to 3 days.  Her concern is that she signed a one-year release and states she cannot get out of it without having a doctor's note stating that her current living situation is causing her anxiety and depression to worsen.  States she also has a history of IBS which has also been flaring up recently. States she has been trying to get in touch with her longtime therapist who she has not seen in approximately 18 months.  States she also has an appointment with a  new therapist later this month. She requests that I write a letter stating her health conditions are being exacerbated by her current living situation to help her get out of her lease.  States she took Wellbutrin for approximately 8 months. States it made her "complacent". Lost desire to do things that she usually enjoys.  She does not want to be on medication  States she was seeing a therapist, Oliver Hum, on [address].   States she is not using drugs or alcohol.  States she has thought about suicide 2 or 3 times however she denies having a plan and states she would never hurt herself.   Depression screen PHQ 2/9 03/05/2019  Decreased Interest 3  Down, Depressed, Hopeless 3  PHQ - 2 Score 6  Altered sleeping 3  Tired, decreased energy 3  Change in appetite 3  Feeling bad or failure about yourself  3  Trouble concentrating 0  Moving slowly or fidgety/restless 3  Suicidal thoughts 1  PHQ-9 Score 22  Difficult doing work/chores Not difficult at all      Review of Systems Pertinent positives and negatives in the history of present illness.     Objective:   Physical Exam Wt 143 lb (64.9 kg)   BMI 21.12 kg/m   Alert and oriented and in no acute distress.  Respirations unlabored.         Assessment & Plan:  Adjustment reaction with anxiety and depression  Irritable bowel syndrome, unspecified type   Verbal contract that she will not harm herself.  She does not have a plan and states she would never follow through with suicide. Declines medication for anxiety and depression. She has an appointment with a therapist in 2 weeks.  She is also attempting to get in touch with her former therapist who has helped her with coping mechanisms for anxiety and depression in the past. This is the first conversation she and I have had regarding mental health issues. Discussed that I am willing to write a letter stating that she states moving out of her current situation would  improve her mental and physical health.  Recommend that she talk to her therapist about writing a letter since she has a longstanding relationship with this person.   Time spent on call was 24 minutes and in review of previous records 3 minutes total.  This virtual service is not related to other E/M service within previous 7 days.

## 2019-05-19 ENCOUNTER — Other Ambulatory Visit: Payer: Self-pay

## 2019-05-19 ENCOUNTER — Ambulatory Visit (INDEPENDENT_AMBULATORY_CARE_PROVIDER_SITE_OTHER): Payer: 59 | Admitting: Podiatry

## 2019-05-19 ENCOUNTER — Encounter: Payer: Self-pay | Admitting: Podiatry

## 2019-05-19 DIAGNOSIS — B351 Tinea unguium: Secondary | ICD-10-CM | POA: Diagnosis not present

## 2019-05-19 DIAGNOSIS — L603 Nail dystrophy: Secondary | ICD-10-CM

## 2019-05-19 NOTE — Patient Instructions (Signed)

## 2019-05-25 NOTE — Progress Notes (Signed)
Subjective:   Patient ID: Rebecca Schmidt, female   DOB: 23 y.o.   MRN: [id number]   HPI 23 year old female presents the office with concerns of discoloration to both of her big toenails.  This is on the last 3 months she noticed.  She states that she noticed after the nail polish.  She states that this is a fungus that she wants to get rid of this.  She denies any pain to the nail currently denies any redness or drainage.  No recent treatment.   Review of Systems  All other systems reviewed and are negative.  Past Medical History:  Diagnosis Date  . IBS (irritable bowel syndrome)     Past Surgical History:  Procedure Laterality Date  . COLONOSCOPY     normal    No current outpatient medications on file.  Allergies  Allergen Reactions  . Ceftriaxone Hives    wheezing  . Rocephin [Ceftriaxone Sodium In Dextrose]     wheezing  . Reglan [Metoclopramide] Hives          Objective:  Physical Exam  General: AAO x3, NAD  Dermatological: Bilateral hallux toenails are hypertrophic, dystrophic with yellow discoloration.  There is no pain in the nail there is no redness or drainage or any signs of infection.  Vascular: Dorsalis Pedis artery and Posterior Tibial artery pedal pulses are 2/4 bilateral with immedate capillary fill time. PThere is no pain with calf compression, swelling, warmth, erythema.   Neruologic: Grossly intact via light touch bilateral. Musculoskeletal: No gross boney pedal deformities bilateral. No pain, crepitus, or limitation noted with foot and ankle range of motion bilateral. Muscular strength 5/5 in all groups tested bilateral.  Gait: Unassisted, Nonantalgic.       Assessment:   Bilateral hallux onychodystrophy, likely onychomycosis    Plan:  -Treatment options discussed including all alternatives, risks, and complications -Etiology of symptoms were discussed -Debrided both the toenails for the any complications sent this for culture, pathology  to [facility] labs.  -Discussed treatment options for nail fungus but will await results of the culture before proceeding with definitive treatment.  Return for nail fungus after culture .  Trula Slade DPM

## 2019-06-03 ENCOUNTER — Telehealth: Payer: Self-pay | Admitting: *Deleted

## 2019-06-03 NOTE — Telephone Encounter (Signed)
-----   Message from Trula Slade, DPM sent at 06/02/2019  8:10 AM EST ----- Val- please let her know that the culture did not show fungus. There is damage to the nail. I would recommend doing a urea gel for the nail and also a biotin supplement for hair, skin, and nails. If still concerned about fungus we could do the topical through [employer] that includes urea.

## 2019-06-03 NOTE — Telephone Encounter (Signed)
I informed pt of Dr. Leigh Aurora review of results and recommendations and explained Revitaderm40 and Amlactin Cream use. Pt states she would prefer to wait on the prescription compound and use the urea and vitamins.

## 2019-07-19 ENCOUNTER — Other Ambulatory Visit: Payer: Self-pay | Admitting: Otolaryngology

## 2019-07-19 DIAGNOSIS — J358 Other chronic diseases of tonsils and adenoids: Secondary | ICD-10-CM

## 2019-07-19 DIAGNOSIS — R591 Generalized enlarged lymph nodes: Secondary | ICD-10-CM

## 2019-08-01 ENCOUNTER — Ambulatory Visit
Admission: RE | Admit: 2019-08-01 | Discharge: 2019-08-01 | Disposition: A | Discharge status: Home / Self Care | Payer: 59 | Source: Ambulatory Visit | Attending: Otolaryngology | Admitting: Otolaryngology

## 2019-08-01 ENCOUNTER — Other Ambulatory Visit: Payer: Self-pay

## 2019-08-01 DIAGNOSIS — J358 Other chronic diseases of tonsils and adenoids: Secondary | ICD-10-CM

## 2019-08-01 DIAGNOSIS — R591 Generalized enlarged lymph nodes: Secondary | ICD-10-CM

## 2019-08-01 MED ORDER — IOPAMIDOL (ISOVUE-300) INJECTION 61%
75.0000 mL | Freq: Once | INTRAVENOUS | Status: AC | PRN
  Administered 2019-08-01: 75 mL via INTRAVENOUS

## 2019-08-12 ENCOUNTER — Other Ambulatory Visit: Payer: Self-pay | Admitting: Otolaryngology

## 2019-08-12 DIAGNOSIS — E041 Nontoxic single thyroid nodule: Secondary | ICD-10-CM

## 2019-08-18 ENCOUNTER — Ambulatory Visit
Admission: RE | Admit: 2019-08-18 | Discharge: 2019-08-18 | Disposition: A | Discharge status: Home / Self Care | Payer: 59 | Source: Ambulatory Visit | Attending: Otolaryngology | Admitting: Otolaryngology

## 2019-08-18 DIAGNOSIS — E041 Nontoxic single thyroid nodule: Secondary | ICD-10-CM

## 2020-02-25 ENCOUNTER — Ambulatory Visit (INDEPENDENT_AMBULATORY_CARE_PROVIDER_SITE_OTHER): Payer: 59 | Admitting: Family Medicine

## 2020-02-25 ENCOUNTER — Other Ambulatory Visit: Payer: Self-pay

## 2020-02-25 ENCOUNTER — Encounter: Payer: Self-pay | Admitting: Family Medicine

## 2020-02-25 VITALS — BP 120/80 | HR 83 | Wt 149.0 lb

## 2020-02-25 DIAGNOSIS — G4700 Insomnia, unspecified: Secondary | ICD-10-CM

## 2020-02-25 DIAGNOSIS — F329 Major depressive disorder, single episode, unspecified: Secondary | ICD-10-CM

## 2020-02-25 DIAGNOSIS — R4689 Other symptoms and signs involving appearance and behavior: Secondary | ICD-10-CM

## 2020-02-25 DIAGNOSIS — F419 Anxiety disorder, unspecified: Secondary | ICD-10-CM

## 2020-02-25 DIAGNOSIS — R41840 Attention and concentration deficit: Secondary | ICD-10-CM

## 2020-02-25 DIAGNOSIS — F32A Depression, unspecified: Secondary | ICD-10-CM

## 2020-02-25 NOTE — Progress Notes (Signed)
Subjective:    Patient ID: Rebecca Schmidt, female    DOB: Oct 27, 1995, 24 y.o.   MRN: [id number]  HPI Chief Complaint  Patient presents with  . anxiety    anxiety and possible ADHD since january getting worse and starts to have temper   States she moved back from [place].  Reports having worsening issues with anxiety and depression.  She was seeing a therapist but is not any longer. She would like to establish with a new one.   She also recently started researching her symptoms online and thinks she may have underlying ADHD. Denies ever being diagnosed but states she has several childhood examples  States she has always had issues with reading comprehension and she failed a grade because of this.  States she is easily distracted and now is getting agitated which has been causing her to have a "temper".  States her recent mood changes has been interfering with her job. Feels "unprofessional".  States she cannot get started with projects.   Relationships with others has always been an issue. States she has not been able to maintain friendships.   States she occasionally has thoughts of not being in this world but realizes suicide is a permanent solution to a temporary problem. States she would not act and would not commit suicide.   Reports being "obsessive" about thinks and having compulsive buying issues.    Having issues falling asleep. Melatonin does not work.   Denies self medicating with alcohol or drugs. She tried "LSD" at one point but did not like how she felt.   States she has a good support network with her mother.   States she took Wellbutrin for approximately 8 months. States it made her "complacent". Lost desire to do things that she usually enjoys.   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   Sees GI, Deliah Goody with [facility] for IBS.   Denies fever, chills, dizziness, chest pain, palpitations, shortness of breath, abdominal pain, N/V/D, urinary symptoms, LE  edema.   Reviewed allergies, medications, past medical, surgical, family, and social history.     Depression screen [facility] 2/9 02/25/2020 03/05/2019  Decreased Interest 3 3  Down, Depressed, Hopeless 3 3  PHQ - 2 Score 6 6  Altered sleeping 3 3  Tired, decreased energy 3 3  Change in appetite 2 3  Feeling bad or failure about yourself  3 3  Trouble concentrating 3 0  Moving slowly or fidgety/restless 3 3  Suicidal thoughts 1 1  PHQ-9 Score 24 22  Difficult doing work/chores Very difficult Not difficult at all    Review of Systems Pertinent positives and negatives in the history of present illness.     Objective:   Physical Exam BP 120/80   Pulse 83   Wt 149 lb (67.6 kg)   BMI 22.00 kg/m   Alert and in no distress.  Neck is supple without adenopathy or thyromegaly. Cardiac exam shows a regular sinus rhythm without murmurs or gallops. Lungs are clear to auscultation. Skin is warm and dry.        Assessment & Plan:  Anxiety and depression - Plan: CBC with Differential/Platelet, Comprehensive metabolic panel, TSH, T4, free  Insomnia, unspecified type - Plan: TSH, T4, free  Compulsive behaviors  Difficulty concentrating - Plan: CBC with Differential/Platelet, Comprehensive metabolic panel, TSH, T4, free  She does not appear to be in any danger. She and I have a verbal contract that she will not harm herself. She  does not appear to be self medicating.  Discussed that she has a complex mental health history and I recommend she see a psychiatrist and counselor to help [place] with accurate diagnosis and treatment. A list of offices was provided and she will call to schedule.  She is agreeable. Check labs and follow up.

## 2020-02-25 NOTE — Patient Instructions (Signed)
You can call to schedule your appointment with the psychiatrist/counselor. A few offices are listed below for you to call.    [place] for a psychiatrist  [place]  (across from [facility])  [facility] [address] [place], [place] [address] [facility]  [address] [place], [place], [place] [address]  Phone: [phone]   [place] [place] [address] [place], [place] [address]  Phone: [phone]

## 2020-02-26 LAB — TSH: TSH: 1.03 u[IU]/mL (ref 0.450–4.500)

## 2020-02-26 LAB — CBC WITH DIFFERENTIAL/PLATELET
Basophils Absolute: 0 10*3/uL (ref 0.0–0.2)
Basos: 1 %
EOS (ABSOLUTE): 0.1 10*3/uL (ref 0.0–0.4)
Eos: 1 %
Hematocrit: 40.1 % (ref 34.0–46.6)
Hemoglobin: 13.7 g/dL (ref 11.1–15.9)
Immature Grans (Abs): 0 10*3/uL (ref 0.0–0.1)
Immature Granulocytes: 0 %
Lymphocytes Absolute: 2.2 10*3/uL (ref 0.7–3.1)
Lymphs: 40 %
MCH: 30.8 pg (ref 26.6–33.0)
MCHC: 34.2 g/dL (ref 31.5–35.7)
MCV: 90 fL (ref 79–97)
Monocytes Absolute: 0.6 10*3/uL (ref 0.1–0.9)
Monocytes: 11 %
Neutrophils Absolute: 2.6 10*3/uL (ref 1.4–7.0)
Neutrophils: 47 %
Platelets: 246 10*3/uL (ref 150–450)
RBC: 4.45 x10E6/uL (ref 3.77–5.28)
RDW: 12.4 % (ref 11.7–15.4)
WBC: 5.5 10*3/uL (ref 3.4–10.8)

## 2020-02-26 LAB — COMPREHENSIVE METABOLIC PANEL
ALT: 15 IU/L (ref 0–32)
AST: 18 IU/L (ref 0–40)
Albumin/Globulin Ratio: 1.7 (ref 1.2–2.2)
Albumin: 5.1 g/dL — ABNORMAL HIGH (ref 3.9–5.0)
Alkaline Phosphatase: 59 IU/L (ref 44–121)
BUN/Creatinine Ratio: 13 (ref 9–23)
BUN: 8 mg/dL (ref 6–20)
Bilirubin Total: 0.4 mg/dL (ref 0.0–1.2)
CO2: 23 mmol/L (ref 20–29)
Calcium: 9.8 mg/dL (ref 8.7–10.2)
Chloride: 101 mmol/L (ref 96–106)
Creatinine, Ser: 0.63 mg/dL (ref 0.57–1.00)
GFR calc Af Amer: 145 mL/min/{1.73_m2} (ref >59)
GFR calc non Af Amer: 126 mL/min/{1.73_m2} (ref >59)
Globulin, Total: 3 g/dL (ref 1.5–4.5)
Glucose: 77 mg/dL (ref 65–99)
Potassium: 4.6 mmol/L (ref 3.5–5.2)
Sodium: 138 mmol/L (ref 134–144)
Total Protein: 8.1 g/dL (ref 6.0–8.5)

## 2020-02-26 LAB — T4, FREE: Free T4: 1.41 ng/dL (ref 0.82–1.77)

## 2020-03-31 ENCOUNTER — Other Ambulatory Visit (HOSPITAL_COMMUNITY): Payer: Self-pay | Admitting: Internal Medicine

## 2020-03-31 ENCOUNTER — Ambulatory Visit: Payer: 59 | Attending: Internal Medicine

## 2020-03-31 DIAGNOSIS — Z23 Encounter for immunization: Secondary | ICD-10-CM

## 2020-03-31 NOTE — Progress Notes (Signed)
   Covid-19 Vaccination Clinic  Name:  HUGH GARROW    MRN: [id number] DOB: [birth year]  03/31/2020  Ms. Godown was observed post Covid-19 immunization for 15 minutes without incident. She was provided with Vaccine Information Sheet and instruction to access the V-Safe system.   Ms. Labrum was instructed to call 911 with any severe reactions post vaccine: Marland Kitchen Difficulty breathing  . Swelling of face and throat  . A fast heartbeat  . A bad rash all over body  . Dizziness and weakness

## 2020-04-13 ENCOUNTER — Telehealth: Payer: Self-pay | Admitting: Internal Medicine

## 2020-04-13 NOTE — Telephone Encounter (Signed)
Pt called and states that she called everyone on the list and couldn't get in soon so one officerreferred her to [facility] where she did a virtual with Allen NP-C on 03/19/20. However she states he asked her one question about her moods she said yes and he then diagnosed her with Bipolar off one question and put her on Lamitcal that she takes 1 tablet at night. She feels she is still experiencing ADHD, irriation with depression and aniexty. He gave her a virtual  follow-up appt but she doesn't know when  Or does she have a phone number to the office. She is not sure she feels comfortable going back to do another visit with him. She says he didn't listen to any of her problems that she had and wants to know what does she do from here. She can't find a website for them to follow-up on with

## 2020-04-13 NOTE — Telephone Encounter (Signed)
We can do a virtual and try to figure out the next step.

## 2020-04-14 NOTE — Telephone Encounter (Signed)
Pt scheduled for tomorrow

## 2020-04-15 ENCOUNTER — Telehealth (INDEPENDENT_AMBULATORY_CARE_PROVIDER_SITE_OTHER): Payer: 59 | Admitting: Family Medicine

## 2020-04-15 ENCOUNTER — Encounter: Payer: Self-pay | Admitting: Family Medicine

## 2020-04-15 ENCOUNTER — Telehealth: Payer: Self-pay | Admitting: Internal Medicine

## 2020-04-15 ENCOUNTER — Other Ambulatory Visit: Payer: Self-pay

## 2020-04-15 VITALS — BP 117/72 | HR 71 | Temp 98.2°F | Wt 143.0 lb

## 2020-04-15 DIAGNOSIS — R4689 Other symptoms and signs involving appearance and behavior: Secondary | ICD-10-CM

## 2020-04-15 DIAGNOSIS — F419 Anxiety disorder, unspecified: Secondary | ICD-10-CM

## 2020-04-15 DIAGNOSIS — F32A Depression, unspecified: Secondary | ICD-10-CM

## 2020-04-15 DIAGNOSIS — R41840 Attention and concentration deficit: Secondary | ICD-10-CM | POA: Diagnosis not present

## 2020-04-15 NOTE — Telephone Encounter (Signed)
Please re-send our list of psychiatrists.  Ask her to try [facility]. Then she can check with [facility] (new building) near [place] and ask for an appointment with a psychiatrist if she is a resident of [place]. If those options do not work, I recommend she do a Producer, television/film/video and see if there is someone that sounds like a good fit for her.

## 2020-04-15 NOTE — Progress Notes (Signed)
° °  Subjective:  Documentation for virtual audio and video telecommunications through [place] encounter:  The patient was located in her car. 2 patient identifiers used.  The provider was located in the office. The patient did consent to this visit and is aware of possible charges through their insurance for this visit.  The other persons participating in this telemedicine service were none. Time spent on call was 17 minutes and in review of previous records 20 minutes total.  This virtual service is not related to other E/M service within previous 7 days.    Patient ID: Rebecca Schmidt, female    DOB: 09-02-1995, 24 y.o.   MRN: [id number]  HPI Chief Complaint  Patient presents with   discuss followup with therapist    follow-up about therapst   This is a virtual visit to discuss the fact that she is having difficulty finding a psychiatrist.  She has been having issues with anxiety and depression as well as irritability and impulsive behavior.  She is concerned that she may have underlying ADHD. States she gets upset over "stupid things".  Annoyed with her environment.   States she has had a difficult time finding a psychiatrist. States she was dismissed from [facility] a few months ago.  States one practice advised her to call [facility]. States she has had 2 visits with  Angelica Pou, NP. Last one was 03/19/2020  States she was diagnosed with bipolar disorder. States she is not upset about the diagnosis but with the quick diagnosis.  States she was basically asked about her mood being up and down and then was told she had bipolar disorder. Reports starting lamotrigine as prescribed and has been taking it since March 20, 2020.  States she has not noticed any difference in her mood.  She questions whether she needs to continue taking it.  States she has been told that she is guarded around men.  Think she will do better with a female mental health  specialist.     Review of Systems Pertinent positives and negatives in the history of present illness.     Objective:   Physical Exam BP 117/72    Pulse 71    Temp 98.2 F (36.8 C)    Wt 143 lb (64.9 kg)    BMI 21.12 kg/m   Alert oriented and in no acute distress.  Respirations unlabored.  Normal speech, mood and thought process.      Assessment & Plan:  Anxiety and depression  Difficulty concentrating  Compulsive behaviors  I provided her with names of additional psychiatrists and practices.  Encouraged her to call and schedule an appointment since she was not comfortable with her last provider and she would like a second opinion.  Recommend she continue taking Lamictal for now unless she has any worsening thoughts or side effects and until she establishes with a new psychiatrist.  She will let me know if she needs any additional advice

## 2020-04-15 NOTE — Telephone Encounter (Signed)
Pt called Dr. [facility] and they are booked out until may of 2022 and then [facility] is booked out until end of January 2022. Pt can not wait that long and wants to know if you know of anyone outside of [place] that she can look into

## 2020-04-15 NOTE — Telephone Encounter (Signed)
Tried to call pt but left a vm that I will send a message to her through mychart about some places to call

## 2020-04-16 NOTE — Telephone Encounter (Signed)
Pt saw mychart message

## 2020-08-21 ENCOUNTER — Other Ambulatory Visit (HOSPITAL_COMMUNITY): Payer: Self-pay

## 2020-08-25 ENCOUNTER — Emergency Department (HOSPITAL_COMMUNITY): Payer: 59

## 2020-08-25 ENCOUNTER — Encounter (HOSPITAL_COMMUNITY): Payer: Self-pay | Admitting: Emergency Medicine

## 2020-08-25 ENCOUNTER — Emergency Department (HOSPITAL_COMMUNITY)
Admission: EM | Admit: 2020-08-25 | Discharge: 2020-08-25 | Disposition: A | Discharge status: Home / Self Care | Payer: 59 | Attending: Emergency Medicine | Admitting: Emergency Medicine

## 2020-08-25 DIAGNOSIS — K581 Irritable bowel syndrome with constipation: Secondary | ICD-10-CM

## 2020-08-25 DIAGNOSIS — R1012 Left upper quadrant pain: Secondary | ICD-10-CM | POA: Insufficient documentation

## 2020-08-25 DIAGNOSIS — N3000 Acute cystitis without hematuria: Secondary | ICD-10-CM

## 2020-08-25 DIAGNOSIS — K5900 Constipation, unspecified: Secondary | ICD-10-CM | POA: Diagnosis not present

## 2020-08-25 LAB — I-STAT CHEM 8, ED
BUN: 12 mg/dL (ref 6–20)
Calcium, Ion: 1.24 mmol/L (ref 1.15–1.40)
Chloride: 109 mmol/L (ref 98–111)
Creatinine, Ser: 0.6 mg/dL (ref 0.44–1.00)
Glucose, Bld: 104 mg/dL — ABNORMAL HIGH (ref 70–99)
HCT: 41 % (ref 36.0–46.0)
Hemoglobin: 13.9 g/dL (ref 12.0–15.0)
Potassium: 3.6 mmol/L (ref 3.5–5.1)
Sodium: 142 mmol/L (ref 135–145)
TCO2: 21 mmol/L — ABNORMAL LOW (ref 22–32)

## 2020-08-25 LAB — CBC WITH DIFFERENTIAL/PLATELET
Abs Immature Granulocytes: 0.05 10*3/uL (ref 0.00–0.07)
Basophils Absolute: 0.1 10*3/uL (ref 0.0–0.1)
Basophils Relative: 0 %
Eosinophils Absolute: 0.1 10*3/uL (ref 0.0–0.5)
Eosinophils Relative: 0 %
HCT: 40.4 % (ref 36.0–46.0)
Hemoglobin: 14.2 g/dL (ref 12.0–15.0)
Immature Granulocytes: 0 %
Lymphocytes Relative: 9 %
Lymphs Abs: 1.4 10*3/uL (ref 0.7–4.0)
MCH: 30.9 pg (ref 26.0–34.0)
MCHC: 35.1 g/dL (ref 30.0–36.0)
MCV: 88 fL (ref 80.0–100.0)
Monocytes Absolute: 1.1 10*3/uL — ABNORMAL HIGH (ref 0.1–1.0)
Monocytes Relative: 7 %
Neutro Abs: 13.1 10*3/uL — ABNORMAL HIGH (ref 1.7–7.7)
Neutrophils Relative %: 84 %
Platelets: 256 10*3/uL (ref 150–400)
RBC: 4.59 MIL/uL (ref 3.87–5.11)
RDW: 11.6 % (ref 11.5–15.5)
WBC: 15.8 10*3/uL — ABNORMAL HIGH (ref 4.0–10.5)
nRBC: 0 % (ref 0.0–0.2)

## 2020-08-25 LAB — URINALYSIS, ROUTINE W REFLEX MICROSCOPIC
Bilirubin Urine: NEGATIVE
Glucose, UA: NEGATIVE mg/dL
Hgb urine dipstick: NEGATIVE
Ketones, ur: 20 mg/dL — AB
Nitrite: NEGATIVE
Protein, ur: NEGATIVE mg/dL
Specific Gravity, Urine: 1.03 (ref 1.005–1.030)
pH: 5 (ref 5.0–8.0)

## 2020-08-25 LAB — COMPREHENSIVE METABOLIC PANEL
ALT: 17 U/L (ref 0–44)
AST: 19 U/L (ref 15–41)
Albumin: 4.6 g/dL (ref 3.5–5.0)
Alkaline Phosphatase: 45 U/L (ref 38–126)
Anion gap: 9 (ref 5–15)
BUN: 11 mg/dL (ref 6–20)
CO2: 21 mmol/L — ABNORMAL LOW (ref 22–32)
Calcium: 9.7 mg/dL (ref 8.9–10.3)
Chloride: 108 mmol/L (ref 98–111)
Creatinine, Ser: 0.69 mg/dL (ref 0.44–1.00)
GFR, Estimated: >60 mL/min (ref >60)
Glucose, Bld: 106 mg/dL — ABNORMAL HIGH (ref 70–99)
Potassium: 3.5 mmol/L (ref 3.5–5.1)
Sodium: 138 mmol/L (ref 135–145)
Total Bilirubin: 0.8 mg/dL (ref 0.3–1.2)
Total Protein: 7.3 g/dL (ref 6.5–8.1)

## 2020-08-25 LAB — I-STAT BETA HCG BLOOD, ED (MC, WL, AP ONLY): I-stat hCG, quantitative: <5 m[IU]/mL (ref <5)

## 2020-08-25 MED ORDER — NITROFURANTOIN MONOHYD MACRO 100 MG PO CAPS: 100.0000 mg | ORAL_CAPSULE | Freq: Two times a day (BID) | ORAL | 0 refills | Status: DC

## 2020-08-25 MED ORDER — KETOROLAC TROMETHAMINE 60 MG/2ML IM SOLN
60.0000 mg | Freq: Once | INTRAMUSCULAR | Status: AC
  Administered 2020-08-25: 60 mg via INTRAMUSCULAR
  Filled 2020-08-25: qty 2

## 2020-08-25 MED ORDER — ALUM & MAG HYDROXIDE-SIMETH 200-200-20 MG/5ML PO SUSP
30.0000 mL | Freq: Once | ORAL | Status: AC
  Administered 2020-08-25: 30 mL via ORAL
  Filled 2020-08-25: qty 30

## 2020-08-25 MED ORDER — DICYCLOMINE HCL 20 MG PO TABS: 20.0000 mg | ORAL_TABLET | Freq: Two times a day (BID) | ORAL | 0 refills | Status: DC

## 2020-08-25 MED ORDER — NITROFURANTOIN MONOHYD MACRO 100 MG PO CAPS
100.0000 mg | ORAL_CAPSULE | Freq: Once | ORAL | Status: AC
  Administered 2020-08-25: 100 mg via ORAL
  Filled 2020-08-25: qty 1

## 2020-08-25 MED ORDER — DICYCLOMINE HCL 10 MG PO CAPS: 10.0000 mg | ORAL_CAPSULE | Freq: Once | ORAL | Status: DC

## 2020-08-25 MED ORDER — ONDANSETRON 4 MG PO TBDP
8.0000 mg | ORAL_TABLET | Freq: Once | ORAL | Status: AC
  Administered 2020-08-25: 8 mg via ORAL
  Filled 2020-08-25: qty 2

## 2020-08-25 NOTE — ED Provider Notes (Signed)
[facility] EMERGENCY DEPARTMENT Provider Note   CSN: [id number] Arrival date & time: 08/25/20  7829     History No chief complaint on file.   Rebecca Schmidt is a 25 y.o. female.  The history is provided by the patient.  Abdominal Pain Pain location:  LUQ Pain quality: cramping   Pain radiates to:  Does not radiate Pain severity:  Severe Onset quality:  Sudden Timing:  Constant Progression:  Unchanged Chronicity:  Recurrent Context: not alcohol use, not awakening from sleep, not diet changes, not eating, not laxative use, not medication withdrawal, not recent illness, not recent sexual activity and not recent travel   Relieved by:  Nothing Worsened by:  Nothing Ineffective treatments: Miralax  Associated symptoms: constipation   Associated symptoms: no anorexia, no belching, no chest pain, no chills, no cough, no diarrhea, no dysuria, no fatigue, no fever, no flatus, no hematemesis, no hematochezia, no hematuria, no melena, no nausea, no shortness of breath, no sore throat, no vaginal bleeding and no vaginal discharge   Associated symptoms comment:  Self induced one episode of emesis  Risk factors: no alcohol abuse        Past Medical History:  Diagnosis Date  . IBS (irritable bowel syndrome)     Patient Active Problem List   Diagnosis Date Noted  . Peritonsillar abscess 06/17/2018  . Family history of hypercholesterolemia 06/28/2015  . Family history of melanoma 06/28/2015  . Frequent headaches 04/05/2015  . Irritable bowel syndrome (IBS) 10/01/2013    Past Surgical History:  Procedure Laterality Date  . COLONOSCOPY     normal     OB History   No obstetric history on file.     Family History  Problem Relation Age of Onset  . Depression Mother   . Irritable bowel syndrome Mother   . Cancer Mother        skin  . Cancer Father   . Diabetes Father   . Hypertension Father   . Crohn's disease Maternal Grandfather   . Cancer  Maternal Grandfather   . Depression Brother     Social History   Tobacco Use  . Smoking status: Never Smoker  . Smokeless tobacco: Never Used  Substance Use Topics  . Alcohol use: Yes    Comment: rare  . Drug use: No    Home Medications Prior to Admission medications   Medication Sig Start Date End Date Taking? Authorizing Provider  nitrofurantoin, macrocrystal-monohydrate, (MACROBID) 100 MG capsule Take 1 capsule (100 mg total) by mouth 2 (two) times daily. X 7 days 08/25/20  Yes Graciela Plato, MD  lamoTRIgine (LAMICTAL) 25 MG tablet Take by mouth. 03/19/20   [provider]  Polyethylene Glycol 3350 (MIRALAX PO) Take by mouth.    [provider]  Probiotic Product (ALIGN PO) Take by mouth.    [provider]    Allergies    Ceftriaxone, Rocephin [ceftriaxone sodium in dextrose], and Reglan [metoclopramide]  Review of Systems   Review of Systems  Constitutional: Negative for chills, fatigue and fever.  HENT: Negative for sore throat.   Eyes: Negative for visual disturbance.  Respiratory: Negative for cough and shortness of breath.   Cardiovascular: Negative for chest pain.  Gastrointestinal: Positive for abdominal pain and constipation. Negative for anorexia, diarrhea, flatus, hematemesis, hematochezia, melena and nausea.  Genitourinary: Negative for dysuria, hematuria, vaginal bleeding and vaginal discharge.  Musculoskeletal: Negative for arthralgias.  Skin: Negative for rash.  Neurological: Negative for facial asymmetry.  Psychiatric/Behavioral: Negative for agitation. The patient is nervous/anxious.     Physical Exam Updated Vital Signs BP 120/78 (BP Location: Right Arm)   Pulse 97   Temp 98.1 F (36.7 C) (Oral)   Resp 16   Ht 5' 8.75" (1.746 m)   Wt 65.8 kg   SpO2 100%   BMI 21.57 kg/m   Physical Exam Vitals and nursing note reviewed.  Constitutional:      General: She is not in acute distress.    Appearance: Normal  appearance.  HENT:     Head: Normocephalic and atraumatic.     Nose: Nose normal.  Eyes:     Conjunctiva/sclera: Conjunctivae normal.     Pupils: Pupils are equal, round, and reactive to light.  Cardiovascular:     Rate and Rhythm: Normal rate and regular rhythm.     Pulses: Normal pulses.     Heart sounds: Normal heart sounds.  Pulmonary:     Effort: Pulmonary effort is normal.     Breath sounds: Normal breath sounds.  Abdominal:     General: Abdomen is flat. Bowel sounds are normal.     Palpations: Abdomen is soft. There is no mass.     Tenderness: There is no abdominal tenderness. There is no guarding or rebound.     Hernia: No hernia is present.  Musculoskeletal:        General: Normal range of motion.     Cervical back: Normal range of motion and neck supple.  Skin:    General: Skin is warm and dry.     Capillary Refill: Capillary refill takes less than 2 seconds.  Neurological:     General: No focal deficit present.     Mental Status: She is alert and oriented to person, place, and time.     Deep Tendon Reflexes: Reflexes normal.  Psychiatric:        Mood and Affect: Mood normal.        Behavior: Behavior normal.     ED Results / Procedures / Treatments   Labs (all labs ordered are listed, but only abnormal results are displayed) Results for orders placed or performed during the hospital encounter of 08/25/20  Comprehensive metabolic panel  Result Value Ref Range   Sodium 138 135 - 145 mmol/L   Potassium 3.5 3.5 - 5.1 mmol/L   Chloride 108 98 - 111 mmol/L   CO2 21 (L) 22 - 32 mmol/L   Glucose, Bld 106 (H) 70 - 99 mg/dL   BUN 11 6 - 20 mg/dL   Creatinine, Ser 0.69 0.44 - 1.00 mg/dL   Calcium 9.7 8.9 - 10.3 mg/dL   Total Protein 7.3 6.5 - 8.1 g/dL   Albumin 4.6 3.5 - 5.0 g/dL   AST 19 15 - 41 U/L   ALT 17 0 - 44 U/L   Alkaline Phosphatase 45 38 - 126 U/L   Total Bilirubin 0.8 0.3 - 1.2 mg/dL   GFR, Estimated >60 >60 mL/min   Anion gap 9 5 - 15  CBC with  Diff  Result Value Ref Range   WBC 15.8 (H) 4.0 - 10.5 K/uL   RBC 4.59 3.87 - 5.11 MIL/uL   Hemoglobin 14.2 12.0 - 15.0 g/dL   HCT 40.4 36.0 - 46.0 %   MCV 88.0 80.0 - 100.0 fL   MCH 30.9 26.0 - 34.0 pg   MCHC 35.1 30.0 - 36.0 g/dL   RDW 11.6 11.5 - 15.5 %   Platelets 256 150 -  400 K/uL   nRBC 0.0 0.0 - 0.2 %   Neutrophils Relative % 84 %   Neutro Abs 13.1 (H) 1.7 - 7.7 K/uL   Lymphocytes Relative 9 %   Lymphs Abs 1.4 0.7 - 4.0 K/uL   Monocytes Relative 7 %   Monocytes Absolute 1.1 (H) 0.1 - 1.0 K/uL   Eosinophils Relative 0 %   Eosinophils Absolute 0.1 0.0 - 0.5 K/uL   Basophils Relative 0 %   Basophils Absolute 0.1 0.0 - 0.1 K/uL   Immature Granulocytes 0 %   Abs Immature Granulocytes 0.05 0.00 - 0.07 K/uL  Urinalysis, Routine w reflex microscopic Urine, Clean Catch  Result Value Ref Range   Color, Urine YELLOW YELLOW   APPearance HAZY (A) CLEAR   Specific Gravity, Urine 1.030 1.005 - 1.030   pH 5.0 5.0 - 8.0   Glucose, UA NEGATIVE NEGATIVE mg/dL   Hgb urine dipstick NEGATIVE NEGATIVE   Bilirubin Urine NEGATIVE NEGATIVE   Ketones, ur 20 (A) NEGATIVE mg/dL   Protein, ur NEGATIVE NEGATIVE mg/dL   Nitrite NEGATIVE NEGATIVE   Leukocytes,Ua TRACE (A) NEGATIVE   RBC / HPF 0-5 0 - 5 RBC/hpf   WBC, UA 6-10 0 - 5 WBC/hpf   Bacteria, UA RARE (A) NONE SEEN   Squamous Epithelial / LPF 6-10 0 - 5   Mucus PRESENT   I-Stat beta hCG blood, ED  Result Value Ref Range   I-stat hCG, quantitative <5.0 <5 mIU/mL   Comment 3          I-stat chem 8, ED (not at [facility] or [facility])  Result Value Ref Range   Sodium 142 135 - 145 mmol/L   Potassium 3.6 3.5 - 5.1 mmol/L   Chloride 109 98 - 111 mmol/L   BUN 12 6 - 20 mg/dL   Creatinine, Ser 0.60 0.44 - 1.00 mg/dL   Glucose, Bld 104 (H) 70 - 99 mg/dL   Calcium, Ion 1.24 1.15 - 1.40 mmol/L   TCO2 21 (L) 22 - 32 mmol/L   Hemoglobin 13.9 12.0 - 15.0 g/dL   HCT 41.0 36.0 - 46.0 %   CT Renal Stone Study  Result Date: 08/25/2020 CLINICAL DATA:   Flank pain, nephrolithiasis EXAM: CT ABDOMEN AND PELVIS WITHOUT CONTRAST TECHNIQUE: Multidetector CT imaging of the abdomen and pelvis was performed following the standard protocol without IV contrast. COMPARISON:  None. FINDINGS: Lower chest: The visualized lung bases are clear bilaterally. The visualized heart and pericardium are unremarkable. Hepatobiliary: No focal liver abnormality is seen. No gallstones, gallbladder wall thickening, or biliary dilatation. Pancreas: Unremarkable Spleen: Unremarkable Adrenals/Urinary Tract: Adrenal glands are unremarkable. Kidneys are normal, without renal calculi, focal lesion, or hydronephrosis. Bladder is unremarkable. Stomach/Bowel: Stomach is within normal limits. Appendix appears normal. No evidence of bowel wall thickening, distention, or inflammatory changes. No free intraperitoneal gas or fluid. Vascular/Lymphatic: No significant vascular findings are present. No enlarged abdominal or pelvic lymph nodes. Reproductive: Uterus and bilateral adnexa are unremarkable. Other: No abdominal wall hernia.  Rectum unremarkable. Musculoskeletal: No acute or significant osseous findings. IMPRESSION: No acute intra-abdominal pathology identified. No radiographic explanation for the patient's reported symptoms. Normal noncontrast evaluation of the kidneys and renal collecting system. No nephro or urolithiasis. Electronically Signed   By: Fidela Salisbury MD   On: 08/25/2020 04:54    EKG None  Radiology CT Renal Stone Study  Result Date: 08/25/2020 CLINICAL DATA:  Flank pain, nephrolithiasis EXAM: CT ABDOMEN AND PELVIS WITHOUT CONTRAST TECHNIQUE: Multidetector CT imaging  of the abdomen and pelvis was performed following the standard protocol without IV contrast. COMPARISON:  None. FINDINGS: Lower chest: The visualized lung bases are clear bilaterally. The visualized heart and pericardium are unremarkable. Hepatobiliary: No focal liver abnormality is seen. No gallstones,  gallbladder wall thickening, or biliary dilatation. Pancreas: Unremarkable Spleen: Unremarkable Adrenals/Urinary Tract: Adrenal glands are unremarkable. Kidneys are normal, without renal calculi, focal lesion, or hydronephrosis. Bladder is unremarkable. Stomach/Bowel: Stomach is within normal limits. Appendix appears normal. No evidence of bowel wall thickening, distention, or inflammatory changes. No free intraperitoneal gas or fluid. Vascular/Lymphatic: No significant vascular findings are present. No enlarged abdominal or pelvic lymph nodes. Reproductive: Uterus and bilateral adnexa are unremarkable. Other: No abdominal wall hernia.  Rectum unremarkable. Musculoskeletal: No acute or significant osseous findings. IMPRESSION: No acute intra-abdominal pathology identified. No radiographic explanation for the patient's reported symptoms. Normal noncontrast evaluation of the kidneys and renal collecting system. No nephro or urolithiasis. Electronically Signed   By: Fidela Salisbury MD   On: 08/25/2020 04:54    Procedures Procedures   Medications Ordered in ED Medications  ketorolac (TORADOL) injection 60 mg (has no administration in time range)  nitrofurantoin (macrocrystal-monohydrate) (MACROBID) capsule 100 mg (has no administration in time range)  alum & mag hydroxide-simeth (MAALOX/MYLANTA) 200-200-20 MG/5ML suspension 30 mL (30 mLs Oral Given 08/25/20 0423)  ondansetron (ZOFRAN-ODT) disintegrating tablet 8 mg (8 mg Oral Given 08/25/20 0423)    ED Course  I have reviewed the triage vital signs and the nursing notes.  Pertinent labs & imaging results that were available during my care of the patient were reviewed by me and considered in my medical decision making (see chart for details).    I suspect this is all bowel spasm related to patient's known IBS.  I will treat the very mild UTI, this is not the cause of the pain at this time.  I have advised that the patient follow up with her GI specialist  for ongoing care.   Final Clinical Impression(s) / ED Diagnoses Final diagnoses:  None   Return for intractable cough, coughing up blood, fevers >100.4 unrelieved by medication, shortness of breath, intractable vomiting, chest pain, shortness of breath, weakness, numbness, changes in speech, facial asymmetry, abdominal pain, passing out, Inability to tolerate liquids or food, cough, altered mental status or any concerns. No signs of systemic illness or infection. The patient is nontoxic-appearing on exam and vital signs are within normal limits.  I have reviewed the triage vital signs and the nursing notes. Pertinent labs & imaging results that were available during my care of the patient were reviewed by me and considered in my medical decision making (see chart for details). After history, exam, and medical workup I feel the patient has been appropriately medically screened and is safe for discharge home. Pertinent diagnoses were discussed with the patient. Patient was given return precautions.   Rx / DC Orders ED Discharge Orders         Ordered    nitrofurantoin, macrocrystal-monohydrate, (MACROBID) 100 MG capsule  2 times daily        08/25/20 0500           Amilio Zehnder, MD 08/25/20 [phone]

## 2020-08-25 NOTE — ED Triage Notes (Signed)
Pt here from home with c/o left side abd pain along with some n/v , pt has hx of IBS but wants to r/o sob or ulcer

## 2020-09-03 ENCOUNTER — Encounter: Payer: Self-pay | Admitting: Internal Medicine

## 2020-09-07 ENCOUNTER — Other Ambulatory Visit (HOSPITAL_COMMUNITY): Payer: Self-pay

## 2020-09-08 ENCOUNTER — Other Ambulatory Visit (HOSPITAL_COMMUNITY): Payer: Self-pay

## 2020-09-08 MED ORDER — DICYCLOMINE HCL 10 MG PO CAPS
10.0000 mg | ORAL_CAPSULE | Freq: Three times a day (TID) | ORAL | 3 refills | Status: DC
  Filled 2020-09-08: qty 90, 30d supply, fill #0

## 2020-09-09 ENCOUNTER — Other Ambulatory Visit (HOSPITAL_COMMUNITY): Payer: Self-pay

## 2020-09-09 MED ORDER — METHYLPHENIDATE HCL ER 20 MG PO TBCR
EXTENDED_RELEASE_TABLET | ORAL | 0 refills | Status: DC
  Filled 2020-09-10: qty 15, 15d supply, fill #0

## 2020-09-10 ENCOUNTER — Other Ambulatory Visit (HOSPITAL_COMMUNITY): Payer: Self-pay

## 2020-09-27 ENCOUNTER — Other Ambulatory Visit (HOSPITAL_COMMUNITY): Payer: Self-pay

## 2020-09-27 MED ORDER — VYVANSE 30 MG PO CAPS
ORAL_CAPSULE | ORAL | 0 refills | Status: DC
  Filled 2020-09-27: qty 7, 7d supply, fill #0

## 2020-10-04 ENCOUNTER — Other Ambulatory Visit (HOSPITAL_COMMUNITY): Payer: Self-pay

## 2020-10-04 MED ORDER — VYVANSE 30 MG PO CAPS
ORAL_CAPSULE | ORAL | 0 refills | Status: DC
  Filled 2020-10-04: qty 30, 30d supply, fill #0

## 2020-10-04 MED ORDER — VYVANSE 10 MG PO CAPS
ORAL_CAPSULE | ORAL | 0 refills | Status: DC
  Filled 2020-10-04: qty 30, 30d supply, fill #0

## 2020-10-11 ENCOUNTER — Other Ambulatory Visit (HOSPITAL_COMMUNITY): Payer: Self-pay

## 2020-10-11 MED ORDER — VYVANSE 40 MG PO CAPS
ORAL_CAPSULE | ORAL | 0 refills | Status: DC
  Filled 2020-10-11 (×2): qty 30, 30d supply, fill #0

## 2020-10-26 ENCOUNTER — Other Ambulatory Visit (HOSPITAL_COMMUNITY): Payer: Self-pay

## 2020-10-26 MED ORDER — ESCITALOPRAM OXALATE 10 MG PO TABS
ORAL_TABLET | ORAL | 2 refills | Status: DC
  Filled 2020-10-26: qty 30, 30d supply, fill #0

## 2020-11-04 ENCOUNTER — Other Ambulatory Visit (HOSPITAL_COMMUNITY): Payer: Self-pay

## 2021-02-14 ENCOUNTER — Encounter: Payer: Self-pay | Admitting: Family Medicine

## 2021-02-14 ENCOUNTER — Telehealth (INDEPENDENT_AMBULATORY_CARE_PROVIDER_SITE_OTHER): Payer: 59 | Admitting: Family Medicine

## 2021-02-14 ENCOUNTER — Other Ambulatory Visit: Payer: Self-pay

## 2021-02-14 VITALS — Temp 99.9°F | Wt 145.0 lb

## 2021-02-14 DIAGNOSIS — J029 Acute pharyngitis, unspecified: Secondary | ICD-10-CM

## 2021-02-14 NOTE — Progress Notes (Signed)
   Subjective:    Patient ID: Rebecca Schmidt, female    DOB: 1995-09-24, 25 y.o.   MRN: [id number]  HPI Documentation for virtual audio and video telecommunications through [facility] encounter:  The patient was located at home. 2 patient identifiers used.  The provider was located in the office. The patient did consent to this visit and is aware of possible charges through their insurance for this visit.  The other persons participating in this telemedicine service were none. Time spent on call was 5 minutes and in review of previous records >13 minutes total for counseling and coordination of care.  This virtual service is not related to other E/M service within previous 7 days.  She states that Saturday evening she developed myalgias, fever, sore throat, dry cough with headache and chills.  She did get tested yesterday and the COVID test was negative.  She states that she does not note any swollen lymph nodes and has not seen any exudates on her tonsils.  She has been using DayQuil and NyQuil.  Review of Systems     Objective:   Physical Exam Alert and toxic looking.       Assessment & Plan:  Sore throat I explained that I thought this was a viral infection.  Did discuss doing another COVID test tomorrow to ensure true negativity.  Otherwise treat the symptoms specifically 800 mg 3 times daily of ibuprofen.  Explained that that did not think an antibiotic would be appropriate and she was comfortable with that.

## 2021-02-17 ENCOUNTER — Telehealth: Payer: Self-pay

## 2021-02-17 ENCOUNTER — Telehealth (INDEPENDENT_AMBULATORY_CARE_PROVIDER_SITE_OTHER): Payer: 59 | Admitting: Medical

## 2021-02-17 ENCOUNTER — Other Ambulatory Visit: Payer: Self-pay

## 2021-02-17 VITALS — HR 95 | Temp 97.9°F | Wt 145.0 lb

## 2021-02-17 DIAGNOSIS — R059 Cough, unspecified: Secondary | ICD-10-CM | POA: Diagnosis not present

## 2021-02-17 DIAGNOSIS — U071 COVID-19: Secondary | ICD-10-CM | POA: Diagnosis not present

## 2021-02-17 MED ORDER — AZITHROMYCIN 250 MG PO TABS: ORAL_TABLET | ORAL | 0 refills | Status: DC

## 2021-02-17 NOTE — Telephone Encounter (Signed)
Pt called and advised that she was covid . She had an virtual with Dr. Redmond School on the 19th and was advised to treat the symptoms. Pt was offered appt and she stated she would call back tomorrow after trying mucinex. [place]

## 2021-02-17 NOTE — Patient Instructions (Signed)
  General recommendations: I recommend you rest, hydrate well with water and clear fluids throughout the day.   You can use Tylenol or Advil for pain or fever You can use over the counter Mucinex DM for cough. You can add Benadryl for congestion. Consider nasal saline flush to help with mucous If not improving by weekend, add Zpak antibiotic sent today to pharmacy You can use over the counter Emetrol for nausea.    Continue Airborne vitamin pack.  If you are having trouble breathing, if you are very weak, have high fever 103 or higher consistently despite Tylenol, or uncontrollable nausea and vomiting, then call or go to the emergency department.    If you have other questions or have other symptoms or questions you are concerned about then please make a virtual visit  Covid symptoms such as fatigue and cough can linger over 2 weeks, even after the initial fever, aches, chills, and other initial symptoms.   Self Quarantine: The CDC, Centers for Disease Control has recommended a self quarantine of 5 days from the start of your illness until you are symptom-free including at least 24 hours of no symptoms including no fever, no shortness of breath, and no body aches and chills, by day 5 before returning to work or general contact with the public.  What does self quarantine mean: avoiding contact with people as much as possible.   Particularly in your house, isolate your self from others in a separate room, wear a mask when possible in the room, particularly if coughing a lot.   Have others bring food, water, medications, etc., to your door, but avoid direct contact with your household contacts during this time to avoid spreading the infection to them.   If you have a separate bathroom and living quarters during the next 2 weeks away from others, that would be preferable.    If you can't completely isolate, then wear a mask, wash hands frequently with soap and water for at least 15 seconds, minimize  close contact with others, and have a friend or family member check regularly from a distance to make sure you are not getting seriously worse.     You should not be going out in public, should not be going to stores, to work or other public places until all your symptoms have resolved and at least 5 days + 24 hours of no symptoms at all have transpired.   Ideally you should avoid contact with others for a full 5 days if possible.  One of the goals is to limit spread to high risk people; people that are older and elderly, people with multiple health issues like diabetes, heart disease, lung disease, and anybody that has weakened immune systems such as people with cancer or on immunosuppressive therapy.

## 2021-02-17 NOTE — Progress Notes (Signed)
Subjective:     Patient ID: Rebecca Schmidt, female   DOB: 06/27/1995, 25 y.o.   MRN: [id number]  This visit type was conducted due to national recommendations for restrictions regarding the COVID-19 Pandemic (e.g. social distancing) in an effort to limit this patient's exposure and mitigate transmission in our community.  Due to their co-morbid illnesses, this patient is at least at moderate risk for complications without adequate follow up.  This format is felt to be most appropriate for this patient at this time.    Documentation for virtual audio and video telecommunications through [place] encounter:  The patient was located at home. The provider was located in the office. The patient did consent to this visit and is aware of possible charges through their insurance for this visit.  The other persons participating in this telemedicine service were none. Time spent on call was 20 minutes and in review of previous records 20 minutes total.  This virtual service is not related to other E/M service within previous 7 days.   HPI Chief Complaint  Patient presents with   Covid Positive    Tested positive 02/15/21 symptoms started Saturday/sunday   Virtual consult for illness.  She has had 5-day history of cough, congestion, body aches, diarrhea but no fever.  No shortness of breath or wheezing.  Her main symptom is just thick mucus congestion and cough.  Just feels a lot of thick colored mucus in her sinuses and throat.  She does not get sick very often.  No history of asthma.  Non-smoker.  No sick contacts.  She did a virtual consult with another provider here a few days ago, was advised supportive care over-the-counter.  She is using airborne vitamin pack she is also using DayQuil and NyQuil.  However she switched to Mucinex yesterday.  She has tried some Advil.  Water intake is good.  No other aggravating or relieving factors. No other complaint.  Past Medical History:  Diagnosis  Date   IBS (irritable bowel syndrome)    Current Outpatient Medications on File Prior to Visit  Medication Sig Dispense Refill   escitalopram (LEXAPRO) 10 MG tablet Take 1 tablet by mouth once daily 30 tablet 2   Polyethylene Glycol 3350 (MIRALAX PO) Take by mouth.     No current facility-administered medications on file prior to visit.     Review of Systems As in subjective    Objective:   Physical Exam Due to coronavirus pandemic stay at home measures, patient visit was virtual and they were not examined in person.   Pulse 95   Temp 97.9 F (36.6 C)   Wt 145 lb (65.8 kg)   SpO2 98%   BMI 21.57 kg/m       Assessment:     Encounter Diagnoses  Name Primary?   COVID-19 virus infection Yes   Cough        Plan:      We discussed your symptoms and concerns.  I recommend the following  General recommendations: I recommend you rest, hydrate well with water and clear fluids throughout the day.   You can use Tylenol or Advil for pain or fever You can use over the counter Mucinex DM for cough. You can add Benadryl for congestion. Consider nasal saline flush to help with mucous If not improving by weekend, add Zpak antibiotic sent today to pharmacy You can use over the counter Emetrol for nausea.    Continue Airborne vitamin pack.  If you are  having trouble breathing, if you are very weak, have high fever 103 or higher consistently despite Tylenol, or uncontrollable nausea and vomiting, then call or go to the emergency department.    If you have other questions or have other symptoms or questions you are concerned about then please make a virtual visit  Covid symptoms such as fatigue and cough can linger over 2 weeks, even after the initial fever, aches, chills, and other initial symptoms.   Self Quarantine: The CDC, Centers for Disease Control has recommended a self quarantine of 5 days from the start of your illness until you are symptom-free including at least 24  hours of no symptoms including no fever, no shortness of breath, and no body aches and chills, by day 5 before returning to work or general contact with the public.  What does self quarantine mean: avoiding contact with people as much as possible.   Particularly in your house, isolate your self from others in a separate room, wear a mask when possible in the room, particularly if coughing a lot.   Have others bring food, water, medications, etc., to your door, but avoid direct contact with your household contacts during this time to avoid spreading the infection to them.   If you have a separate bathroom and living quarters during the next 2 weeks away from others, that would be preferable.    If you can't completely isolate, then wear a mask, wash hands frequently with soap and water for at least 15 seconds, minimize close contact with others, and have a friend or family member check regularly from a distance to make sure you are not getting seriously worse.     You should not be going out in public, should not be going to stores, to work or other public places until all your symptoms have resolved and at least 5 days + 24 hours of no symptoms at all have transpired.   Ideally you should avoid contact with others for a full 5 days if possible.  One of the goals is to limit spread to high risk people; people that are older and elderly, people with multiple health issues like diabetes, heart disease, lung disease, and anybody that has weakened immune systems such as people with cancer or on immunosuppressive therapy.        [name] was seen today for covid positive.  Diagnoses and all orders for this visit:  COVID-19 virus infection  Cough  Other orders -     azithromycin (ZITHROMAX) 250 MG tablet; 2 tablets day 1, then 1 tablet days 2-4  F/u prn

## 2021-02-21 ENCOUNTER — Telehealth: Payer: Self-pay | Admitting: Medical

## 2021-02-21 NOTE — Telephone Encounter (Signed)
Pt had a virtual appt with you on 9/23. She stated that about 4 days ago she has been having painful ringing in her ears and they both are stopped up so bad she cant hear in one of them. Did you want pt to come in for an appt or virtual?

## 2021-02-22 ENCOUNTER — Ambulatory Visit (INDEPENDENT_AMBULATORY_CARE_PROVIDER_SITE_OTHER): Payer: 59 | Admitting: Medical

## 2021-02-22 DIAGNOSIS — J029 Acute pharyngitis, unspecified: Secondary | ICD-10-CM | POA: Diagnosis not present

## 2021-02-22 DIAGNOSIS — R059 Cough, unspecified: Secondary | ICD-10-CM | POA: Diagnosis not present

## 2021-02-22 DIAGNOSIS — H6690 Otitis media, unspecified, unspecified ear: Secondary | ICD-10-CM

## 2021-02-22 DIAGNOSIS — Z8616 Personal history of COVID-19: Secondary | ICD-10-CM

## 2021-02-22 MED ORDER — PREDNISONE 10 MG PO TABS: ORAL_TABLET | ORAL | 0 refills | Status: DC

## 2021-02-22 MED ORDER — AMOXICILLIN 875 MG PO TABS: 875.0000 mg | ORAL_TABLET | Freq: Two times a day (BID) | ORAL | 0 refills | Status: AC

## 2021-02-22 NOTE — Progress Notes (Signed)
Subjective:     Patient ID: Rebecca Schmidt, female   DOB: 10-Feb-1996, 25 y.o.   MRN: [id number]  HPI Chief Complaint  Patient presents with   Sinus Problem   Here for recheck.   Part of the visit was over a video and phone, part was done out in the parking lot since she tested positive for COVID recently.  Her main symptoms are ongoing ear pressure, cannot hear out of her ears, ears feel clogged up, still has some cough and sore throat.  She is still using Mucinex and cough suppressant.  She completed Z-Pak except for last days today.  That has not seemed to help much as far as the year but did help with her chest.  She is using her Nettie pot.  No current fevers, no body aches or chills, mainly sinus pressure, ear pressure, cough and sore throat.  Her employer wanted her to come back today but she does not feel up to it.  Given her positive COVID test on September 20, she has been out of work at least 10 days at this point.  Symptoms started on September 17.  We talked at last visit on September 22, and at that time she had cough, congestion, body aches, diarrhea but no fever.  No shortness of breath or wheezing.  No history of asthma.  Non-smoker.  No sick contacts.  No other aggravating or relieving factors. No other complaint.  Past Medical History:  Diagnosis Date   IBS (irritable bowel syndrome)    Current Outpatient Medications on File Prior to Visit  Medication Sig Dispense Refill   azithromycin (ZITHROMAX) 250 MG tablet 2 tablets day 1, then 1 tablet days 2-4 6 tablet 0   escitalopram (LEXAPRO) 10 MG tablet Take 1 tablet by mouth once daily 30 tablet 2   Polyethylene Glycol 3350 (MIRALAX PO) Take by mouth.     No current facility-administered medications on file prior to visit.     Review of Systems As in subjective    Objective:   Physical Exam There were no vitals taken for this visit.  I examined patient out in the parking lot with PPE given her recent COVID  test.  General appearence: alert, no distress, WD/WN, somewhat ill appearing HEENT: normocephalic, sclerae anicteric, TMs flat with erythema bilat, nares patent, no discharge or erythema, pharynx normal Oral cavity: MMM, no lesions Neck: supple, no lymphadenopathy, no thyromegaly, no masses Heart: RRR, normal S1, S2, no murmurs Lungs: CTA bilaterally, no wheezes, rhonchi, or rales       Assessment:     Encounter Diagnoses  Name Primary?   Acute otitis media, unspecified otitis media type Yes   Sore throat    Cough    History of COVID-19         Plan:      Given her symptoms and exam findings we will go ahead and use the medications prescribed below.  We discussed her prior allergies to cephalosporins.  She completed all the 1 day Z-Pak.  She will stop that since is not working.  Continue good hydration, rest, continue the over-the-counter remedies she is using for mucus.  If not much improved within the next 48 hours call back.  I will await her FMLA paperwork as she is missed over 10 days of work now.  I asked her to give me the exact dates she has missed.  She was positive for COVID but those chest symptoms for the most part have  resolved other than the ongoing ear pressure and ear infection and sinus infection symptoms.  I advised her to go back to work in 2 to 3 days on Friday if much improved   [name] was seen today for sinus problem.  Diagnoses and all orders for this visit:  Acute otitis media, unspecified otitis media type  Sore throat  Cough  History of COVID-19  Other orders -     amoxicillin (AMOXIL) 875 MG tablet; Take 1 tablet (875 mg total) by mouth 2 (two) times daily for 10 days. -     predniSONE (DELTASONE) 10 MG tablet; 6 tablets day 1, 5 tablets day 2, 4 tablets day 3, 3 tablets day 4, 2 tablets day 5, 1 tablet day 6   F/u call report 2-3 days

## 2021-03-01 ENCOUNTER — Encounter: Payer: Self-pay | Admitting: Medical

## 2021-03-01 ENCOUNTER — Ambulatory Visit (INDEPENDENT_AMBULATORY_CARE_PROVIDER_SITE_OTHER): Payer: 59 | Admitting: Medical

## 2021-03-01 ENCOUNTER — Other Ambulatory Visit: Payer: Self-pay

## 2021-03-01 VITALS — BP 112/78 | HR 71 | Temp 98.4°F | Ht 68.0 in | Wt 149.4 lb

## 2021-03-01 DIAGNOSIS — R059 Cough, unspecified: Secondary | ICD-10-CM

## 2021-03-01 DIAGNOSIS — H938X3 Other specified disorders of ear, bilateral: Secondary | ICD-10-CM | POA: Diagnosis not present

## 2021-03-01 DIAGNOSIS — H6983 Other specified disorders of Eustachian tube, bilateral: Secondary | ICD-10-CM

## 2021-03-01 NOTE — Progress Notes (Signed)
Subjective:  Rebecca Schmidt is a 25 y.o. female who presents for Chief Complaint  Patient presents with   Covid Positive    2 week follow up, continues to have cough    Ear Fullness    Sinus pressure- frontal lobe and optic, hx of ear and sinus infection     Here for follow up.  Been dealing with ongoing ear pressure and congestion.  Started with covid when we have virtual consult on 02/14/21.  I saw her again 02/22/21.  At this point she is completed a round of prednisone steroid and 2 different antibiotics.  She still feels some facial flushing, discoloration of her eyes such as bruising and you still feel pressurized.  Throat feels dusty, scratchy.  Still have some cough.  Overall improved but not back to normal.  She denies history of allergies but after a surgery she had last year for tonsils, she notes that she has had trouble in the spring with congestion and runny nose.  She wonders if she is developing fall allergies as well  No other aggravating or relieving factors.    No other c/o.   The following portions of the patient's history were reviewed and updated as appropriate: allergies, current medications, past family history, past medical history, past social history, past surgical history and problem list.  ROS Otherwise as in subjective above    Objective: BP 112/78 (BP Location: Left Arm, Patient Position: Sitting)   Pulse 71   Temp 98.4 F (36.9 C) (Tympanic)   Ht 5\' 8"  (1.727 m)   Wt 149 lb 6.4 oz (67.8 kg)   LMP 02/22/2021   SpO2 98%   BMI 22.72 kg/m   General appearance: alert, no distress, well developed, well nourished HEENT: normocephalic, sclerae anicteric, conjunctiva pink and moist, TMs flat, nares patent, clear discharge , no erythema, pharynx normal Oral cavity: MMM, no lesions Neck: supple, no lymphadenopathy, no thyromegaly, no masses Heart: RRR, normal S1, S2, no murmurs Lungs: CTA bilaterally, no wheezes, rhonchi, or  rales    Assessment: Encounter Diagnoses  Name Primary?   Ear pressure, bilateral Yes   Dysfunction of both eustachian tubes    Cough, unspecified type      Plan: We discussed her symptoms and exam findings.  At this point she has completed prednisone 2 rounds of antibiotics.  I do not think she needs any more of either  We discussed continued hydration.  Begin samples below instead of the other remedies she is using.  I suspect symptoms will gradually improve over the next week.  She can also add over-the-counter Flonase or Afrin for 3 to 4 days max.  If still no significant improvement by the end of the week then I recommend referral to ENT specialist   Rebecca Schmidt was seen today for covid positive and ear fullness.  Diagnoses and all orders for this visit:  Ear pressure, bilateral  Dysfunction of both eustachian tubes  Cough, unspecified type  Other orders -     Chlorphen-PE-Acetaminophen (NOREL AD) 4-10-325 MG TABS; Take 1 tablet by mouth in the morning and at bedtime.   Follow up: prn

## 2021-03-01 NOTE — Patient Instructions (Signed)
Recommendations: Finish prednisone You have already finished the antibiotic Begin the samples of Norel AD for congestion/mucous, twice daily for 3-6 days Continue good water intake Consider OTC Afrin nasal spray or Flonase nasal for the next few days as well, particularly at bedtime If not much improvement by end of week, let me know and I'll refer to ENT specialist

## 2021-03-02 MED ORDER — NOREL AD 4-10-325 MG PO TABS: 1.0000 | ORAL_TABLET | Freq: Two times a day (BID) | ORAL | 0 refills | Status: DC

## 2021-05-05 ENCOUNTER — Ambulatory Visit (INDEPENDENT_AMBULATORY_CARE_PROVIDER_SITE_OTHER): Payer: Self-pay | Admitting: Family Medicine

## 2021-05-05 ENCOUNTER — Other Ambulatory Visit: Payer: Self-pay

## 2021-05-05 ENCOUNTER — Encounter: Payer: Self-pay | Admitting: Family Medicine

## 2021-05-05 VITALS — BP 120/70 | HR 72 | Temp 99.1°F | Ht 68.0 in | Wt 149.2 lb

## 2021-05-05 DIAGNOSIS — R110 Nausea: Secondary | ICD-10-CM

## 2021-05-05 DIAGNOSIS — J309 Allergic rhinitis, unspecified: Secondary | ICD-10-CM

## 2021-05-05 DIAGNOSIS — F419 Anxiety disorder, unspecified: Secondary | ICD-10-CM

## 2021-05-05 DIAGNOSIS — F32A Depression, unspecified: Secondary | ICD-10-CM

## 2021-05-05 DIAGNOSIS — K219 Gastro-esophageal reflux disease without esophagitis: Secondary | ICD-10-CM

## 2021-05-05 MED ORDER — ONDANSETRON 4 MG PO TBDP: 4.0000 mg | ORAL_TABLET | Freq: Three times a day (TID) | ORAL | 0 refills | Status: DC | PRN

## 2021-05-05 NOTE — Patient Instructions (Signed)
  Take omeprazole 20mg  (Prilosec OTC) once daily prior to dinner.  You can double up if needed (either taking it twice daily, or 2 pills before dinner, which is a prescription dose of 40mg ) Start taking Mucinex 12 (plain kind) twice daily.  This is an expectorant that will thin out the mucus.  It is important to stay well hydrated.  Cut back on the caffeine and drink more water (goal is for very pale to clear urine) Since there may be an allergy component in addition to reflux (GERD), let's have you start an anithistamine.   Avoid xyzal due to the severe nausea it seems to cause.  There are other antihistamines to use if needed.  Claritin or Allegra would be better.  Avoid zyrtec (too similar to xyzal).  I'm prescribing ondansetron for nausea.  If this is too expensive, other options are phenergan (which is a little more sedating, and you need to swallow with water).  I'm sending in #10 now to have on hand (if very expensive, you can try picking up a smaller quantity, and not the whole prescription). I do recommend having this on hand for when you restart the lexapro. In restarting the lexapro start at 1/4 to 1/2 tablet to start, and increasing weekly as tolerated to the full 10mg .

## 2021-05-05 NOTE — Progress Notes (Signed)
Chief Complaint  Patient presents with   Nausea    Has been having some issues with nausea. Thinks it may be PND. Also thought it could be GERD, has a hx-has been sleeping mostly sitting up. She notices it mostly when she has been eating dairy. She tried taking xyzal thinking it would help the PND, it made it worse(wonders if she could be allergic to it). She mentions she does not drink much alcohol, she went to the beach in Nov and had a beer and that made the nausea worse(was taking xyzal at the time). Friday she had a class of wine, took a few sips    continue    And was very nauseous very quickly. Also stopped her lexapro in Nov, which also makes her nauseous and she wasn't sure if that had something to do with all that is going on. Would like to get back on this medication if you think she should. She does know there will be a period where she may feel sick all over again with starting it.    Patient presents with complaints of ongoing nausea.  Nausea is mainly at night.  She has dry heaves and gagging.  Gets up yellow mucus, which is thick.  Rarely has nausea during the day.  She gets a strange feeling in her stomach after eating, related to IBS, but not nausea. Recently noted nausea with  alcohol (doesn't drink much). When at the beach with friends, had a beer. Waited at least 5 hours after drinking and eating (other than a chip or two).  As soon as she laid down, had immediate onset of nausea.  She had taken xyzal that night as well. She wonders if she is allergic to the xyzal and if that is causing the nausea, rather than PND.  She hadn't taken any xyzal since 11/8.  Took it this past Friday (thinking PND was contributing to nausea), and she instantaneously had severe nausea again.  Gets some frontal headaches, or behind an eye.  Not having consistent headaches. No runny nose, occasional sneeze (clear mucus).  No itchy eyes. No throat-clearing or cough. Has a cat, thinks she may be  allergic.  Ice cream also triggers nausea (any dairy) She plans to back on caffeine, had increased caffeine intake recently. Has been having V8 energy drinks, 3/d.  Just finished the last pack.  She has h/o GERD (treated through an ENT). She takes omeprazole 20mg  only if bad with her diet. Sleeps with HOB elevated, almost to 90 degrees  She has anxiety.  Reports she has been off the lexapro for about a month.  She notes some worsening anxiety since stopping.  Has an upcoming overseas trip that is making her anxious.  Leaves 12/22.  Wants to get back on it, but may wait until she gets back from [place]. She recalls having nausea when it was previously started, but then seemed to tolerate it.  Denies pregnancy, risk for pregnancy, not on OCP's  PMH, PSH, SH reviewed  Outpatient Encounter Medications as of 05/05/2021  Medication Sig Note   omeprazole (PRILOSEC) 20 MG capsule Take 20 mg by mouth daily. 05/05/2021: May have taken last night, cannot remember   escitalopram (LEXAPRO) 10 MG tablet Take 1 tablet by mouth once daily (Patient not taking: Reported on 03/01/2021) 05/05/2021: Has not taken since early Nov   Polyethylene Glycol 3350 (MIRALAX PO) Take by mouth. (Patient not taking: Reported on 05/05/2021) 05/05/2021: Uses and needed, last used in  Nov.   [DISCONTINUED] azithromycin (ZITHROMAX) 250 MG tablet 2 tablets day 1, then 1 tablet days 2-4 (Patient not taking: Reported on 03/01/2021)    [DISCONTINUED] Chlorphen-PE-Acetaminophen (NOREL AD) 4-10-325 MG TABS Take 1 tablet by mouth in the morning and at bedtime.    [DISCONTINUED] predniSONE (DELTASONE) 10 MG tablet 6 tablets day 1, 5 tablets day 2, 4 tablets day 3, 3 tablets day 4, 2 tablets day 5, 1 tablet day 6 03/01/2021: 2 more days left   No facility-administered encounter medications on file as of 05/05/2021.   ROS: no fever, chills, URI symptoms, cough, shortness of breath, chest pain, heartburn, change in bowels, urinary complaints.   Some recurring anxiety.  Nausea, PND per HPI, See HPI.   PHYSICAL EXAM:  BP 120/70   Pulse 72   Temp 99.1 F (37.3 C) (Tympanic)   Ht 5\' 8"  (1.727 m)   Wt 149 lb 3.2 oz (67.7 kg)   LMP 04/11/2021 (Approximate)   BMI 22.69 kg/m   Wt Readings from Last 3 Encounters:  05/05/21 149 lb 3.2 oz (67.7 kg)  03/01/21 149 lb 6.4 oz (67.8 kg)  02/17/21 145 lb (65.8 kg)   Pleasant, well-appearing female in no distress. She is alert, oriented, doesn't sound congested, no sniffling, throat-clearing or coughing during visit. HEENT: conjunctiva and sclera are clear, EOMI.  TM's and EAC's normal bilaterally Nasal mucosa is pale, moderately edematous, no mucus present.  Sinuses are nontender. Slight cobblestoning noted in posterior OP, otherwise OP normal Neck: no lymphadenopathy or mass Heart: regular rate and rhythm Lungs: clear bilaterally Neuro: alert and oriented, normal strength, gait, cranial nerves grossly intact Psych: mildly anxious.  Full range of affect. Normal eye contact, hygiene and grooming   ASSESSMENT/PLAN:  Nausea - Ddx reviewed in detail, suspect multifactorial--PND, anxiety, GERD, SE of meds (xyzal).   - Plan: ondansetron (ZOFRAN-ODT) 4 MG disintegrating tablet  Allergic rhinitis, unspecified seasonality, unspecified trigger - reviewed alternative antihistamines, and flonase, including proper technique.  Rec Mucinex to treat the thick PND  Gastroesophageal reflux disease without esophagitis - known dx.  Reviewed diet/behavioral measures.  Trial of daily PPI (can increase to bid or 40mg  daily if not effective)  Anxiety and depression - recurring anxiety since stopped lexapro, wants to restart. This may trigger nausea--to start very slowly, and advance as tolerated to 10mg   I spent 35 minutes dedicated to the care of this patient, including pre-visit review of records, face to face time, post-visit ordering of testing and documentation.  Take omeprazole 20mg  (Prilosec OTC)  once daily prior to dinner.  You can double up if needed (either taking it twice daily, or 2 pills before dinner, which is a prescription dose of 40mg ) Start taking Mucinex 12 (plain kind) twice daily.  This is an expectorant that will thin out the mucus.  It is important to stay well hydrated.  Cut back on the caffeine and drink more water (goal is for very pale to clear urine) Since there may be an allergy component in addition to reflux (GERD), let's have you start an anithistamine.   Avoid xyzal due to the severe nausea it seems to cause.  There are other antihistamines to use if needed.  Claritin or Allegra would be better.  Avoid zyrtec (too similar to xyzal).  I'm prescribing ondansetron for nausea.  If this is too expensive, other options are phenergan (which is a little more sedating, and you need to swallow with water).  I'm sending in #10 now to  have on hand (if very expensive, you can try picking up a smaller quantity, and not the whole prescription). I do recommend having this on hand for when you restart the lexapro. In restarting the lexapro start at 1/4 to 1/2 tablet to start, and increasing weekly as tolerated to the full 10mg .

## 2021-05-06 ENCOUNTER — Other Ambulatory Visit: Payer: Self-pay

## 2021-08-02 ENCOUNTER — Encounter: Payer: Self-pay | Admitting: Physician Assistant

## 2021-08-02 ENCOUNTER — Telehealth: Payer: 59 | Admitting: Physician Assistant

## 2021-08-02 ENCOUNTER — Other Ambulatory Visit: Payer: Self-pay

## 2021-08-02 VITALS — Ht 69.0 in | Wt 149.0 lb

## 2021-08-02 DIAGNOSIS — J3089 Other allergic rhinitis: Secondary | ICD-10-CM | POA: Diagnosis not present

## 2021-08-02 DIAGNOSIS — J0140 Acute pansinusitis, unspecified: Secondary | ICD-10-CM | POA: Diagnosis not present

## 2021-08-02 MED ORDER — DOXYCYCLINE HYCLATE 100 MG PO TABS
100.0000 mg | ORAL_TABLET | Freq: Two times a day (BID) | ORAL | 0 refills | Status: AC
  Filled 2021-08-02: qty 20, 10d supply, fill #0

## 2021-08-02 NOTE — Progress Notes (Signed)
Start time: 9:15 am ?End time: 9:30 am ? ?Virtual Visit via Video Note ? ? Patient ID: Rebecca Schmidt, female    DOB: Jun 30, 1995, 26 y.o.   MRN: [id number] ? ?I connected with above patient on 08/02/21 by a video enabled telemedicine application and verified that I am speaking with the correct person using two identifiers. ? ?Location: ?Patient: home ?Provider: office ?  ?I discussed the limitations of evaluation and management by telemedicine and the availability of in person appointments. The patient expressed understanding and agreed to proceed. ? ?History of Present Illness: ? ?Chief Complaint  ?Patient presents with  ? Nasal Congestion  ?  Runny nose, congestion, sinus pressure. Covid test done at home yesterday was negative  ? ?Patient reports a 6 day history of a sinus HA, has a lot of nasal congestion and runny nose that is yellow with blood; has pressure in cheeks and forehead; no recent travel; was around boss who has strep throat but denies sore throat; is taking Claritin for allergies as well as a nettipot nasal saline rinse; has Flonase, but hasn't used it; denies itchy, watery eyes; denies fever / chills / nausea / vomiting / diarrhea / constipation; has had sinus infections infrequently in the past; tonsillectomy 2 years ago. ? ? ?  ?Observations/Objective: ? ?Ht '5\' 9"'$  (1.753 m)   Wt 149 lb (67.6 kg)   BMI 22.00 kg/m?  ? ? ?Assessment: ?Encounter Diagnoses  ?Name Primary?  ? Acute non-recurrent pansinusitis Yes  ? Non-seasonal allergic rhinitis, unspecified trigger   ? ? ? ?Plan: ? ?Rest, increase clear fluids, OTC Tylenol (acetamenophen) for body aches, headaches, fever, chills as needed.  ? ?For nasal congestion and post nasal drip you can use an OTC nasal saline rinse as well as one of the OTC nasal steroids (generic equivalent) like Flonase (Fluticasone) or Rhinocort (Budesonide) or Nasacort (Triamcinolone) or Nasonex (Mometasone Furoate).  ? ?You can take an OTC decongestant like Sudafed or  phenylephrine to help dry up nasal congestion. Do Not take this medicine if you have high blood pressure or heart palpitations. You can go to a store with a pharmacy and ask them to help you find these medicines. ? ?You can take an OTC expectorant like guaifenesin or Mucinexto help decrease head and nasal congestion.   ? ? ?Gearlene was seen today for nasal congestion. ? ?Diagnoses and all orders for this visit: ? ?Acute non-recurrent pansinusitis ?-     doxycycline (VIBRA-TABS) 100 MG tablet; Take 1 tablet (100 mg total) by mouth 2 (two) times daily for 10 days. ? ?Non-seasonal allergic rhinitis, unspecified trigger ? ? ? ?Follow up: If your symptoms get worse, please go to Urgent Care or go to the Emergency Department for further evaluation in person.  ? ? ? ?I discussed the assessment and treatment plan with the patient. The patient was provided an opportunity to ask questions and all were answered. The patient agreed with the plan and demonstrated an understanding of the instructions. ?  ?The patient was advised to call back or seek an in-person evaluation if the symptoms worsen or if the condition fails to improve as anticipated. ? ?I spent 15 minutes dedicated to the care of this patient, including pre-visit review of records, face to face time, post-visit ordering of testing and documentation. ? ? ? ?Irene Pap, PA-C ?

## 2021-08-15 ENCOUNTER — Other Ambulatory Visit: Payer: Self-pay

## 2021-08-15 MED ORDER — MIRTAZAPINE 7.5 MG PO TABS
ORAL_TABLET | ORAL | 2 refills | Status: DC
  Filled 2021-08-15: qty 30, 30d supply, fill #0

## 2021-08-15 MED ORDER — LORAZEPAM 0.5 MG PO TABS
ORAL_TABLET | ORAL | 1 refills | Status: DC
  Filled 2021-08-15: qty 15, 15d supply, fill #0

## 2021-08-16 ENCOUNTER — Other Ambulatory Visit: Payer: Self-pay

## 2021-08-27 ENCOUNTER — Encounter (HOSPITAL_COMMUNITY): Payer: Self-pay

## 2021-08-27 ENCOUNTER — Other Ambulatory Visit: Payer: Self-pay

## 2021-08-27 ENCOUNTER — Emergency Department (HOSPITAL_COMMUNITY)
Admission: EM | Admit: 2021-08-27 | Discharge: 2021-08-28 | Disposition: A | Discharge status: Home / Self Care | Payer: 59 | Attending: Emergency Medicine | Admitting: Emergency Medicine

## 2021-08-27 DIAGNOSIS — R112 Nausea with vomiting, unspecified: Secondary | ICD-10-CM | POA: Diagnosis not present

## 2021-08-27 DIAGNOSIS — D72829 Elevated white blood cell count, unspecified: Secondary | ICD-10-CM | POA: Insufficient documentation

## 2021-08-27 DIAGNOSIS — E860 Dehydration: Secondary | ICD-10-CM | POA: Diagnosis not present

## 2021-08-27 DIAGNOSIS — R000 Tachycardia, unspecified: Secondary | ICD-10-CM | POA: Diagnosis not present

## 2021-08-27 DIAGNOSIS — R197 Diarrhea, unspecified: Secondary | ICD-10-CM | POA: Diagnosis not present

## 2021-08-27 DIAGNOSIS — R1084 Generalized abdominal pain: Secondary | ICD-10-CM | POA: Diagnosis not present

## 2021-08-27 LAB — COMPREHENSIVE METABOLIC PANEL
ALT: 21 U/L (ref 0–44)
AST: 30 U/L (ref 15–41)
Albumin: 4.8 g/dL (ref 3.5–5.0)
Alkaline Phosphatase: 47 U/L (ref 38–126)
Anion gap: 11 (ref 5–15)
BUN: 15 mg/dL (ref 6–20)
CO2: 19 mmol/L — ABNORMAL LOW (ref 22–32)
Calcium: 9.8 mg/dL (ref 8.9–10.3)
Chloride: 105 mmol/L (ref 98–111)
Creatinine, Ser: 0.81 mg/dL (ref 0.44–1.00)
GFR, Estimated: >60 mL/min (ref >60)
Glucose, Bld: 120 mg/dL — ABNORMAL HIGH (ref 70–99)
Potassium: 3.5 mmol/L (ref 3.5–5.1)
Sodium: 135 mmol/L (ref 135–145)
Total Bilirubin: 1.1 mg/dL (ref 0.3–1.2)
Total Protein: 8.2 g/dL — ABNORMAL HIGH (ref 6.5–8.1)

## 2021-08-27 LAB — CBC
HCT: 43.5 % (ref 36.0–46.0)
Hemoglobin: 15.3 g/dL — ABNORMAL HIGH (ref 12.0–15.0)
MCH: 30.5 pg (ref 26.0–34.0)
MCHC: 35.2 g/dL (ref 30.0–36.0)
MCV: 86.7 fL (ref 80.0–100.0)
Platelets: 256 10*3/uL (ref 150–400)
RBC: 5.02 MIL/uL (ref 3.87–5.11)
RDW: 11.9 % (ref 11.5–15.5)
WBC: 19.9 10*3/uL — ABNORMAL HIGH (ref 4.0–10.5)
nRBC: 0 % (ref 0.0–0.2)

## 2021-08-27 LAB — I-STAT BETA HCG BLOOD, ED (MC, WL, AP ONLY): I-stat hCG, quantitative: <5 m[IU]/mL (ref <5)

## 2021-08-27 LAB — LIPASE, BLOOD: Lipase: 33 U/L (ref 11–51)

## 2021-08-27 MED ORDER — ONDANSETRON HCL 4 MG/2ML IJ SOLN
4.0000 mg | Freq: Once | INTRAMUSCULAR | Status: AC
  Administered 2021-08-27: 4 mg via INTRAVENOUS
  Filled 2021-08-27: qty 2

## 2021-08-27 MED ORDER — KETOROLAC TROMETHAMINE 30 MG/ML IJ SOLN
30.0000 mg | Freq: Once | INTRAMUSCULAR | Status: AC
  Administered 2021-08-27: 30 mg via INTRAVENOUS
  Filled 2021-08-27: qty 1

## 2021-08-27 MED ORDER — LACTATED RINGERS IV BOLUS
1000.0000 mL | Freq: Once | INTRAVENOUS | Status: AC
  Administered 2021-08-27: 1000 mL via INTRAVENOUS

## 2021-08-27 MED ORDER — LORAZEPAM 2 MG/ML IJ SOLN
1.0000 mg | Freq: Once | INTRAMUSCULAR | Status: AC
  Administered 2021-08-27: 1 mg via INTRAVENOUS
  Filled 2021-08-27: qty 1

## 2021-08-27 NOTE — ED Provider Notes (Signed)
?[place] DEPT ?[facility] Emergency Department ?Provider Note ?MRN:  [id number]  ?Arrival date & time: 08/28/21    ? ?Chief Complaint   ?Abdominal Pain ?  ?History of Present Illness   ?Rebecca Schmidt is a 26 y.o. year-old female presents to the ED with chief complaint of nausea, vomiting, and diarrhea.  Patient states that the symptoms started this afternoon.  She states that yesterday she ate raw egg in brownie mix.  She reports having severe abdominal cramps.  She states that she feels dehydrated.  She denies any fever.  Denies any successful treatment PTA. ? ?History provided by patient. ? ? ?Review of Systems  ?Pertinent review of systems noted in HPI.  ? ? ?Physical Exam  ? ?Vitals:  ? 08/28/21 0230 08/28/21 0249  ?BP: 110/77   ?Pulse: (!) 109   ?Resp: 17   ?Temp:  99.1 ?F (37.3 ?C)  ?SpO2: 98%   ?  ?CONSTITUTIONAL:  nontoxic-appearing, NAD ?NEURO:  Alert and oriented x 3, CN 3-12 grossly intact ?EYES:  eyes equal and reactive ?ENT/NECK:  Supple, no stridor  ?CARDIO:  tachycardic, regular rhythm, appears well-perfused  ?PULM:  No respiratory distress, CTAB ?GI/GU:  non-distended, generalized abdominal discomfort without focal tenderness ?MSK/SPINE:  No gross deformities, no edema, moves all extremities  ?SKIN:  no rash, atraumatic ? ? ?*Additional and/or pertinent findings included in MDM below ? ?Diagnostic and Interventional Summary  ? ? EKG Interpretation ? ?Date/Time:  Saturday August 27 2021 21:50:35 EDT ?Ventricular Rate:  113 ?PR Interval:  135 ?QRS Duration: 182 ?QT Interval:  508 ?QTC Calculation: 688 ?R Axis:   41 ?Text Interpretation: Sinus tachycardia Nonspecific T wave abnormality Confirmed by Lajean Saver [phone]) on 08/27/2021 10:07:09 PM ?  ? ?  ? ?Labs Reviewed  ?COMPREHENSIVE METABOLIC PANEL - Abnormal; Notable for the following components:  ?    Result Value  ? CO2 19 (*)   ? Glucose, Bld 120 (*)   ? Total Protein 8.2 (*)   ? All other components within normal limits  ?CBC -  Abnormal; Notable for the following components:  ? WBC 19.9 (*)   ? Hemoglobin 15.3 (*)   ? All other components within normal limits  ?URINALYSIS, ROUTINE W REFLEX MICROSCOPIC - Abnormal; Notable for the following components:  ? Color, Urine AMBER (*)   ? APPearance HAZY (*)   ? Specific Gravity, Urine 1.033 (*)   ? Ketones, ur 20 (*)   ? Protein, ur 30 (*)   ? All other components within normal limits  ?GASTROINTESTINAL PANEL BY PCR, STOOL (REPLACES STOOL CULTURE)  ?LIPASE, BLOOD  ?I-STAT BETA HCG BLOOD, ED ([facility], [facility], [facility] ONLY)  ?  ?No orders to display  ?  ?Medications  ?lactated ringers bolus 1,000 mL (0 mLs Intravenous Stopped 08/28/21 0034)  ?ondansetron [facility]) injection 4 mg (4 mg Intravenous Given 08/27/21 2235)  ?ketorolac (TORADOL) 30 MG/ML injection 30 mg (30 mg Intravenous Given 08/27/21 2236)  ?LORazepam (ATIVAN) injection 1 mg (1 mg Intravenous Given 08/27/21 2305)  ?lactated ringers bolus 1,000 mL (0 mLs Intravenous Stopped 08/28/21 0232)  ?  ? ?Procedures  /  Critical Care ?Procedures ? ?ED Course and Medical Decision Making  ?I have reviewed the triage vital signs, the nursing notes, and pertinent available records from the EMR. ? ?Complexity of Problems Addressed: ?Moderate Complexity: Acute complicated illness or injury, requiring diagnostic workup as ordered and performed below. ?Comorbidities affecting this illness/injury include: ?IBD ?Social Determinants Affecting Care: ? No clinically  significant social determinants affecting this chief complaint.. ? ? ?ED Course: ?After considering the following differential, viral gastroenteritis, food poisoning, Salmonella, I ordered labs, stool culture, fluids and Zofran. ?I personally interpreted the labs which are notable for leukocytosis, but no focal abdominal tenderness.  I doubt surgical or acute abdomen.  She does appear dry, given fluids. . ? ? ?Patient receiving IV fluid and IV pain and nausea medicine to treat symptoms. ?  ? ?Consultants: ?No consultations  were needed in caring for this patient. ? ?Treatment and Plan: ?Nausea, vomiting, diarrhea ?-Continued hydration at home ?-Zofran 4 mg ODT every 8 hour as needed ? ?Emergency department workup does not suggest an emergent condition requiring admission or immediate intervention beyond  what has been performed at this time. The patient is safe for discharge and has  been instructed to return immediately for worsening symptoms, change in  symptoms or any other concerns ? ? ? ?Final Clinical Impressions(s) / ED Diagnoses  ? ?  ICD-10-CM   ?1. Nausea vomiting and diarrhea  R11.2   ? R19.7   ?  ?  ?ED Discharge Orders   ? ?      Ordered  ?  ondansetron (ZOFRAN-ODT) 4 MG disintegrating tablet  Every 8 hours PRN       ? 08/28/21 0204  ? ?  ?  ? ?  ?  ? ? ?Discharge Instructions Discussed with and Provided to Patient:  ? ?Discharge Instructions   ?None ?  ? ?  ?Montine Circle, PA-C ?08/28/21 0402 ? ?  ?Lajean Saver, MD ?08/28/21 1625 ? ?

## 2021-08-27 NOTE — ED Triage Notes (Signed)
Pt complaining of nausea and vomiting that started this evening at 6 pm. Took remeron for the first time immediately before vomiting ?

## 2021-08-28 LAB — URINALYSIS, ROUTINE W REFLEX MICROSCOPIC
Bacteria, UA: NONE SEEN
Bilirubin Urine: NEGATIVE
Glucose, UA: NEGATIVE mg/dL
Hgb urine dipstick: NEGATIVE
Ketones, ur: 20 mg/dL — AB
Leukocytes,Ua: NEGATIVE
Nitrite: NEGATIVE
Protein, ur: 30 mg/dL — AB
Specific Gravity, Urine: 1.033 — ABNORMAL HIGH (ref 1.005–1.030)
pH: 5 (ref 5.0–8.0)

## 2021-08-28 MED ORDER — LACTATED RINGERS IV BOLUS
1000.0000 mL | Freq: Once | INTRAVENOUS | Status: AC
  Administered 2021-08-28: 1000 mL via INTRAVENOUS

## 2021-08-28 MED ORDER — ONDANSETRON 4 MG PO TBDP: 4.0000 mg | ORAL_TABLET | Freq: Three times a day (TID) | ORAL | 0 refills | Status: AC | PRN

## 2021-08-29 ENCOUNTER — Other Ambulatory Visit: Payer: Self-pay

## 2021-08-29 MED ORDER — ONDANSETRON 4 MG PO TBDP
ORAL_TABLET | ORAL | 0 refills | Status: DC
  Filled 2021-08-29: qty 10, 3d supply, fill #0

## 2021-08-29 NOTE — ED Notes (Signed)
$'1mg'J$  Ativan wasted with C. Cobb RN post pt discharge.  ?

## 2021-11-25 ENCOUNTER — Other Ambulatory Visit: Payer: Self-pay

## 2021-11-25 ENCOUNTER — Encounter: Payer: Self-pay | Admitting: Internal Medicine

## 2021-11-30 ENCOUNTER — Other Ambulatory Visit: Payer: Self-pay

## 2021-11-30 MED ORDER — ESCITALOPRAM OXALATE 20 MG PO TABS
ORAL_TABLET | ORAL | 1 refills | Status: DC
  Filled 2021-11-30: qty 90, 90d supply, fill #0

## 2021-12-05 NOTE — Progress Notes (Unsigned)
  No chief complaint on file.    PMH, PSH, SH reviewed    ROS: no fever, chills, URI symptoms, headaches, dizziness, shortness of breath, chest pain. Vaginal discharge per HPI   PHYSICAL EXAM:  There were no vitals taken for this visit.  Wt Readings from Last 3 Encounters:  08/27/21 145 lb (65.8 kg)  08/02/21 149 lb (67.6 kg)  05/05/21 149 lb 3.2 oz (67.7 kg)

## 2021-12-06 ENCOUNTER — Encounter: Payer: Self-pay | Admitting: Family Medicine

## 2021-12-06 ENCOUNTER — Ambulatory Visit: Payer: 59 | Admitting: Family Medicine

## 2021-12-06 VITALS — BP 112/68 | HR 72 | Ht 68.0 in | Wt 164.8 lb

## 2021-12-06 DIAGNOSIS — N898 Other specified noninflammatory disorders of vagina: Secondary | ICD-10-CM

## 2021-12-06 DIAGNOSIS — Z113 Encounter for screening for infections with a predominantly sexual mode of transmission: Secondary | ICD-10-CM | POA: Diagnosis not present

## 2021-12-06 DIAGNOSIS — Z0184 Encounter for antibody response examination: Secondary | ICD-10-CM

## 2021-12-07 LAB — RPR: RPR Ser Ql: NONREACTIVE

## 2021-12-07 LAB — HEPATITIS B SURFACE ANTIGEN: Hepatitis B Surface Ag: NEGATIVE

## 2021-12-07 LAB — HIV ANTIBODY (ROUTINE TESTING W REFLEX): HIV Screen 4th Generation wRfx: NONREACTIVE

## 2021-12-07 LAB — HEPATITIS B SURFACE ANTIBODY,QUALITATIVE: Hep B Surface Ab, Qual: NONREACTIVE

## 2021-12-07 LAB — HEPATITIS C ANTIBODY: Hep C Virus Ab: NONREACTIVE

## 2021-12-10 LAB — NUSWAB VAGINITIS PLUS (VG+)
Candida albicans, NAA: NEGATIVE
Candida glabrata, NAA: NEGATIVE
Chlamydia trachomatis, NAA: NEGATIVE
Neisseria gonorrhoeae, NAA: NEGATIVE
Trich vag by NAA: NEGATIVE

## 2021-12-28 ENCOUNTER — Other Ambulatory Visit: Payer: Self-pay

## 2022-02-01 ENCOUNTER — Encounter: Payer: Self-pay | Admitting: Internal Medicine

## 2022-02-20 ENCOUNTER — Ambulatory Visit: Payer: 59 | Admitting: Medical

## 2022-02-21 ENCOUNTER — Other Ambulatory Visit: Payer: Self-pay

## 2022-02-21 MED ORDER — XULANE 150-35 MCG/24HR TD PTWK
1.0000 | MEDICATED_PATCH | TRANSDERMAL | 3 refills | Status: DC
  Filled 2022-02-21: qty 9, 84d supply, fill #0

## 2022-02-22 ENCOUNTER — Other Ambulatory Visit: Payer: Self-pay

## 2022-02-23 ENCOUNTER — Other Ambulatory Visit: Payer: Self-pay

## 2022-02-23 MED ORDER — ESCITALOPRAM OXALATE 20 MG PO TABS
20.0000 mg | ORAL_TABLET | Freq: Every day | ORAL | 1 refills | Status: AC
  Filled 2022-02-23: qty 90, 90d supply, fill #0
  Filled 2022-06-22: qty 90, 90d supply, fill #1

## 2022-02-24 ENCOUNTER — Other Ambulatory Visit: Payer: Self-pay

## 2022-03-07 ENCOUNTER — Encounter: Payer: Self-pay | Admitting: Internal Medicine

## 2022-04-11 ENCOUNTER — Encounter: Payer: Self-pay | Admitting: Internal Medicine

## 2022-06-22 ENCOUNTER — Other Ambulatory Visit: Payer: Self-pay

## 2022-08-07 ENCOUNTER — Telehealth (INDEPENDENT_AMBULATORY_CARE_PROVIDER_SITE_OTHER): Payer: BC Managed Care – PPO | Admitting: Medical

## 2022-08-07 VITALS — Wt 165.0 lb

## 2022-08-07 DIAGNOSIS — R040 Epistaxis: Secondary | ICD-10-CM

## 2022-08-07 NOTE — Progress Notes (Signed)
Subjective:     Patient ID: Rebecca Schmidt, female   DOB: [birth year], 27 y.o.   MRN: [id number]  This visit type was conducted due to national recommendations for restrictions regarding the COVID-19 Pandemic (e.g. social distancing) in an effort to limit this patient's exposure and mitigate transmission in our community.  Due to their co-morbid illnesses, this patient is at least at moderate risk for complications without adequate follow up.  This format is felt to be most appropriate for this patient at this time.    Documentation for virtual audio and video telecommunications through [facility] encounter:  The patient was located at home. The provider was located in the office. The patient did consent to this visit and is aware of possible charges through their insurance for this visit.  The other persons participating in this telemedicine service were none. Time spent on call was 20 minutes and in review of previous records 20 minutes total.  This virtual service is not related to other E/M service within previous 7 days.   HPI Chief Complaint  Patient presents with   sinus infection    Sinus infection, green mucous, past 4 days had 9-10 nose bleeds, not using a nasal spray.    She notes 6 days ago started having green snot, and sinus pressure.   Has had several nosebleeds recently. Feels like inside of nose is dry and cracked.  No ear pain, no cough, no sore throat.    Has had at least 5 heavy nosebleeds in recent day.  When awakes, has to blow nose the past week or so.  Took about 10-15 minutes for nosebleed to stop with packing.  Prior to this week hasn't had nosebleed since age 30yo.    No heavy periods.  No other bleeding or bruising  No other aggravating or relieving factors. No other complaint.   Past Medical History:  Diagnosis Date   IBS (irritable bowel syndrome)    Current Outpatient Medications on File Prior to Visit  Medication Sig Dispense Refill    acetaminophen (TYLENOL) 500 MG tablet Take 1,000 mg by mouth every 8 (eight) hours as needed for moderate pain.     escitalopram (LEXAPRO) 20 MG tablet Take 1 tablet (20 mg total) by mouth daily. 90 tablet 1   ibuprofen (ADVIL) 200 MG tablet Take 400-800 mg by mouth every 8 (eight) hours as needed for headache or moderate pain.     loratadine (CLARITIN) 10 MG tablet Take 10 mg by mouth daily.     polyethylene glycol powder (GLYCOLAX/MIRALAX) 17 GM/SCOOP powder Take 8.5 g by mouth daily as needed for mild constipation.     dicyclomine (BENTYL) 10 MG capsule Take 10 mg by mouth 3 (three) times daily as needed for spasms. (Patient not taking: Reported on 08/07/2022)     ondansetron (ZOFRAN-ODT) 4 MG disintegrating tablet Take 1 tablet (4 mg total) by mouth every 8 (eight) hours as needed for nausea or vomiting. (Patient not taking: Reported on 12/06/2021) 10 tablet 0   No current facility-administered medications on file prior to visit.     Review of Systems As in subjective    Objective:   Physical Exam Due to coronavirus pandemic stay at home measures, patient visit was virtual and they were not examined in person.   Wt 165 lb (74.8 kg)   BMI 25.09 kg/m   Gen: wd wn, nad Well-appearing      Assessment:     Encounter Diagnosis  Name Primary?  Nosebleed Yes       Plan:     We discussed her concerns.    Begin using triple antibiotic or over-the-counter Neosporin ointment in the nostrils to coat the nostrils twice daily to help moisturize the mucosa.  Do this for the next week or so until things seem to be back to normal  When she gets a nosebleed, advised nasal tampon and holding pressure for at least 10 to 15 minutes if she bleeds again.  Do not pull the nasal tampon out for at least 2 hours and do not blow hard after pulling the nasal tampon out.  If she continues to have bleeding or cannot stop the bleeding and come in right away  No obvious symptom or concern for bleeding  disorder  She can use some over-the-counter allergy pill or nasal saline or Mucinex as needed for the mucus but it sounds like the mucus is cleared up  [name] was seen today for sinus infection.  Diagnoses and all orders for this visit:  Nosebleed  F/u prn

## 2022-08-28 ENCOUNTER — Telehealth: Payer: BC Managed Care – PPO | Admitting: Nurse Practitioner

## 2022-08-28 ENCOUNTER — Other Ambulatory Visit: Payer: Self-pay

## 2022-08-28 VITALS — Wt 165.0 lb

## 2022-08-28 DIAGNOSIS — J111 Influenza due to unidentified influenza virus with other respiratory manifestations: Secondary | ICD-10-CM

## 2022-08-28 MED ORDER — OSELTAMIVIR PHOSPHATE 75 MG PO CAPS
75.0000 mg | ORAL_CAPSULE | Freq: Two times a day (BID) | ORAL | 0 refills | Status: AC
  Filled 2022-08-28: qty 10, 5d supply, fill #0

## 2022-08-28 NOTE — Progress Notes (Signed)
Virtual Visit Encounter mychart visit.   I connected with  Rebecca Schmidt on 08/29/22 at  2:00 PM EDT by secure video and audio telemedicine application. I verified that I am speaking with the correct person using two identifiers.   I introduced myself as a Designer, jewellery with the practice. The limitations of evaluation and management by telemedicine discussed with the patient and the availability of in person appointments. The patient expressed verbal understanding and consent to proceed.  Participating parties in this visit include: Myself and patient  The patient is: Patient Location: Home I am: Provider Location: Office/Clinic Subjective:    CC and HPI: Rebecca Schmidt is a 27 y.o. year old female presenting for new evaluation and treatment of illness.  [name] presents today with a chief complaint of not feeling well since last Friday. Her symptoms include a sore throat, chills, body aches, and fever. These initially were mild, but have drastically worsened since Saturday.   The patient reports that there has been no significant change in her condition, with no noted improvement or worsening of symptoms. [name] denies experiencing any localized pain or pressure when questioned by the provider.  She has not reported taking any medications or undergoing any treatments for her current symptoms. Additionally, [name] has not mentioned any other symptoms, sensations, or experiences associated with her illness.   Past medical history, Surgical history, Family history not pertinant except as noted below, Social history, Allergies, and medications have been entered into the medical record, reviewed, and corrections made.   Review of Systems:  All review of systems negative except what is listed in the HPI  Objective:    Alert and oriented x 4 Patient is ill appearing, laying in bed Congestion is audible Speaking in clear sentences with no shortness of breath. No  distress.  Impression and Recommendations:    Problem List Items Addressed This Visit     Influenza-like illness - Primary    The patient presents with symptoms consistent with an influenza-like illness, including sore throat, chills, body aches, and fever since Friday. Given the symptoms, I strongly suspect influenza. We are on a time crunch for management with anti-viral medication at this point, therefore, testing may cause a delay.  Plan: - Offer Tamiflu (oseltamivir) for antiviral treatment. - Recommend over-the-counter medications such as Dayquil, Nyquil, Tylenol, and ibuprofen for symptom management. - Advise the patient to rest, hydrate, and abstain from work for a minimum of five days. - Provide a letter regarding time off work in the [employer]. - Instruct the patient to contact the clinic if symptoms worsen or do not improve within the expected timeframe.      Relevant Medications   oseltamivir (TAMIFLU) 75 MG capsule    orders and follow up as documented in EMR I discussed the assessment and treatment plan with the patient. The patient was provided an opportunity to ask questions and all were answered. The patient agreed with the plan and demonstrated an understanding of the instructions.   The patient was advised to call back or seek an in-person evaluation if the symptoms worsen or if the condition fails to improve as anticipated.  Follow-Up: prn  I provided 16 minutes of non-face-to-face interaction with this non face-to-face encounter including intake, same-day documentation, and chart review.   Orma Render, NP , DNP, AGNP-c [facility]

## 2022-08-29 DIAGNOSIS — J111 Influenza due to unidentified influenza virus with other respiratory manifestations: Secondary | ICD-10-CM | POA: Insufficient documentation

## 2022-08-29 NOTE — Assessment & Plan Note (Signed)
The patient presents with symptoms consistent with an influenza-like illness, including sore throat, chills, body aches, and fever since Friday. Given the symptoms, I strongly suspect influenza. We are on a time crunch for management with anti-viral medication at this point, therefore, testing may cause a delay.  Plan: - Offer Tamiflu (oseltamivir) for antiviral treatment. - Recommend over-the-counter medications such as Dayquil, Nyquil, Tylenol, and ibuprofen for symptom management. - Advise the patient to rest, hydrate, and abstain from work for a minimum of five days. - Provide a letter regarding time off work in the [employer]. - Instruct the patient to contact the clinic if symptoms worsen or do not improve within the expected timeframe.

## 2022-08-30 ENCOUNTER — Telehealth: Payer: Self-pay | Admitting: Nurse Practitioner

## 2022-08-30 NOTE — Telephone Encounter (Signed)
Pt called, she tested for flu on Monday and she says she has been taking the tamiflu, she does still have a cough and congestion but she needs to return to class. She is asking is she still contagious at this point? She is supposed to go to class today at 18 and she wants to know if it is ok for her to go?

## 2022-08-30 NOTE — Telephone Encounter (Signed)
I am sorry I just saw this. If she has been fever free for 24 hours without medication to lower her temperature, then she can return to class. I would strongly recommend she wear a mask for another week, though, as she is very susceptible to getting something else while her immune system is down.

## 2023-02-11 DIAGNOSIS — F4312 Post-traumatic stress disorder, chronic: Secondary | ICD-10-CM | POA: Diagnosis not present

## 2023-02-11 DIAGNOSIS — F411 Generalized anxiety disorder: Secondary | ICD-10-CM | POA: Diagnosis not present

## 2023-02-11 DIAGNOSIS — F332 Major depressive disorder, recurrent severe without psychotic features: Secondary | ICD-10-CM | POA: Diagnosis not present

## 2023-02-11 DIAGNOSIS — Z79899 Other long term (current) drug therapy: Secondary | ICD-10-CM | POA: Diagnosis not present

## 2023-03-28 DIAGNOSIS — M79672 Pain in left foot: Secondary | ICD-10-CM | POA: Diagnosis not present

## 2023-04-02 DIAGNOSIS — F331 Major depressive disorder, recurrent, moderate: Secondary | ICD-10-CM | POA: Diagnosis not present

## 2023-04-02 DIAGNOSIS — Z79899 Other long term (current) drug therapy: Secondary | ICD-10-CM | POA: Diagnosis not present

## 2023-04-02 DIAGNOSIS — F411 Generalized anxiety disorder: Secondary | ICD-10-CM | POA: Diagnosis not present

## 2023-04-02 DIAGNOSIS — F4312 Post-traumatic stress disorder, chronic: Secondary | ICD-10-CM | POA: Diagnosis not present

## 2023-05-16 DIAGNOSIS — F4312 Post-traumatic stress disorder, chronic: Secondary | ICD-10-CM | POA: Diagnosis not present

## 2023-05-16 DIAGNOSIS — F330 Major depressive disorder, recurrent, mild: Secondary | ICD-10-CM | POA: Diagnosis not present

## 2023-05-16 DIAGNOSIS — Z79899 Other long term (current) drug therapy: Secondary | ICD-10-CM | POA: Diagnosis not present

## 2023-05-16 DIAGNOSIS — F411 Generalized anxiety disorder: Secondary | ICD-10-CM | POA: Diagnosis not present
